# Patient Record
Sex: Male | Born: 1960 | Race: White | Hispanic: No | Marital: Married | Smoking: Never smoker
Health system: Southern US, Community
[De-identification: ages and names within clinical notes are randomized; demographics above are authoritative.]

## PROBLEM LIST (undated history)

## (undated) DIAGNOSIS — E785 Hyperlipidemia, unspecified: Secondary | ICD-10-CM

## (undated) DIAGNOSIS — I10 Essential (primary) hypertension: Secondary | ICD-10-CM

## (undated) DIAGNOSIS — M109 Gout, unspecified: Secondary | ICD-10-CM

## (undated) DIAGNOSIS — I719 Aortic aneurysm of unspecified site, without rupture: Secondary | ICD-10-CM

## (undated) DIAGNOSIS — E079 Disorder of thyroid, unspecified: Secondary | ICD-10-CM

## (undated) DIAGNOSIS — N289 Disorder of kidney and ureter, unspecified: Secondary | ICD-10-CM

## (undated) DIAGNOSIS — Z87442 Personal history of urinary calculi: Secondary | ICD-10-CM

## (undated) HISTORY — PX: BACK SURGERY: SHX140

## (undated) HISTORY — PX: ANKLE ARTHROSCOPY: SHX545

## (undated) HISTORY — PX: KNEE SURGERY: SHX244

## (undated) HISTORY — DX: Hyperlipidemia, unspecified: E78.5

---

## 2003-12-28 ENCOUNTER — Emergency Department (HOSPITAL_COMMUNITY): Admission: EM | Admit: 2003-12-28 | Discharge: 2003-12-28 | Payer: Self-pay | Admitting: *Deleted

## 2003-12-30 ENCOUNTER — Ambulatory Visit (HOSPITAL_COMMUNITY): Admission: RE | Admit: 2003-12-30 | Discharge: 2003-12-30 | Payer: Self-pay | Admitting: Internal Medicine

## 2004-01-07 ENCOUNTER — Observation Stay (HOSPITAL_COMMUNITY): Admission: RE | Admit: 2004-01-07 | Discharge: 2004-01-08 | Payer: Self-pay | Admitting: General Surgery

## 2006-02-05 ENCOUNTER — Emergency Department (HOSPITAL_COMMUNITY): Admission: EM | Admit: 2006-02-05 | Discharge: 2006-02-05 | Payer: Self-pay | Admitting: Emergency Medicine

## 2006-03-17 ENCOUNTER — Ambulatory Visit (HOSPITAL_COMMUNITY): Admission: RE | Admit: 2006-03-17 | Discharge: 2006-03-17 | Payer: Self-pay | Admitting: Urology

## 2006-06-13 ENCOUNTER — Emergency Department (HOSPITAL_COMMUNITY): Admission: EM | Admit: 2006-06-13 | Discharge: 2006-06-13 | Payer: Self-pay | Admitting: Emergency Medicine

## 2006-06-13 ENCOUNTER — Ambulatory Visit (HOSPITAL_COMMUNITY): Admission: RE | Admit: 2006-06-13 | Discharge: 2006-06-13 | Payer: Self-pay | Admitting: Urology

## 2006-06-14 ENCOUNTER — Observation Stay (HOSPITAL_COMMUNITY): Admission: EM | Admit: 2006-06-14 | Discharge: 2006-06-16 | Payer: Self-pay | Admitting: Emergency Medicine

## 2006-06-21 ENCOUNTER — Ambulatory Visit (HOSPITAL_COMMUNITY): Admission: RE | Admit: 2006-06-21 | Discharge: 2006-06-21 | Payer: Self-pay | Admitting: Urology

## 2006-06-22 ENCOUNTER — Ambulatory Visit (HOSPITAL_COMMUNITY): Admission: RE | Admit: 2006-06-22 | Discharge: 2006-06-22 | Payer: Self-pay | Admitting: Urology

## 2007-10-10 IMAGING — CR DG ABDOMEN 1V
2 series · 2 of 2 positions shown · non-contrast
Comparison: none

CLINICAL DATA: Kidney stone. Left-sided pain.  Left-sided ureteral stone.
 ABDOMEN ? 1 VIEW (2 IMAGES):

[view not recorded (1 of 2)]
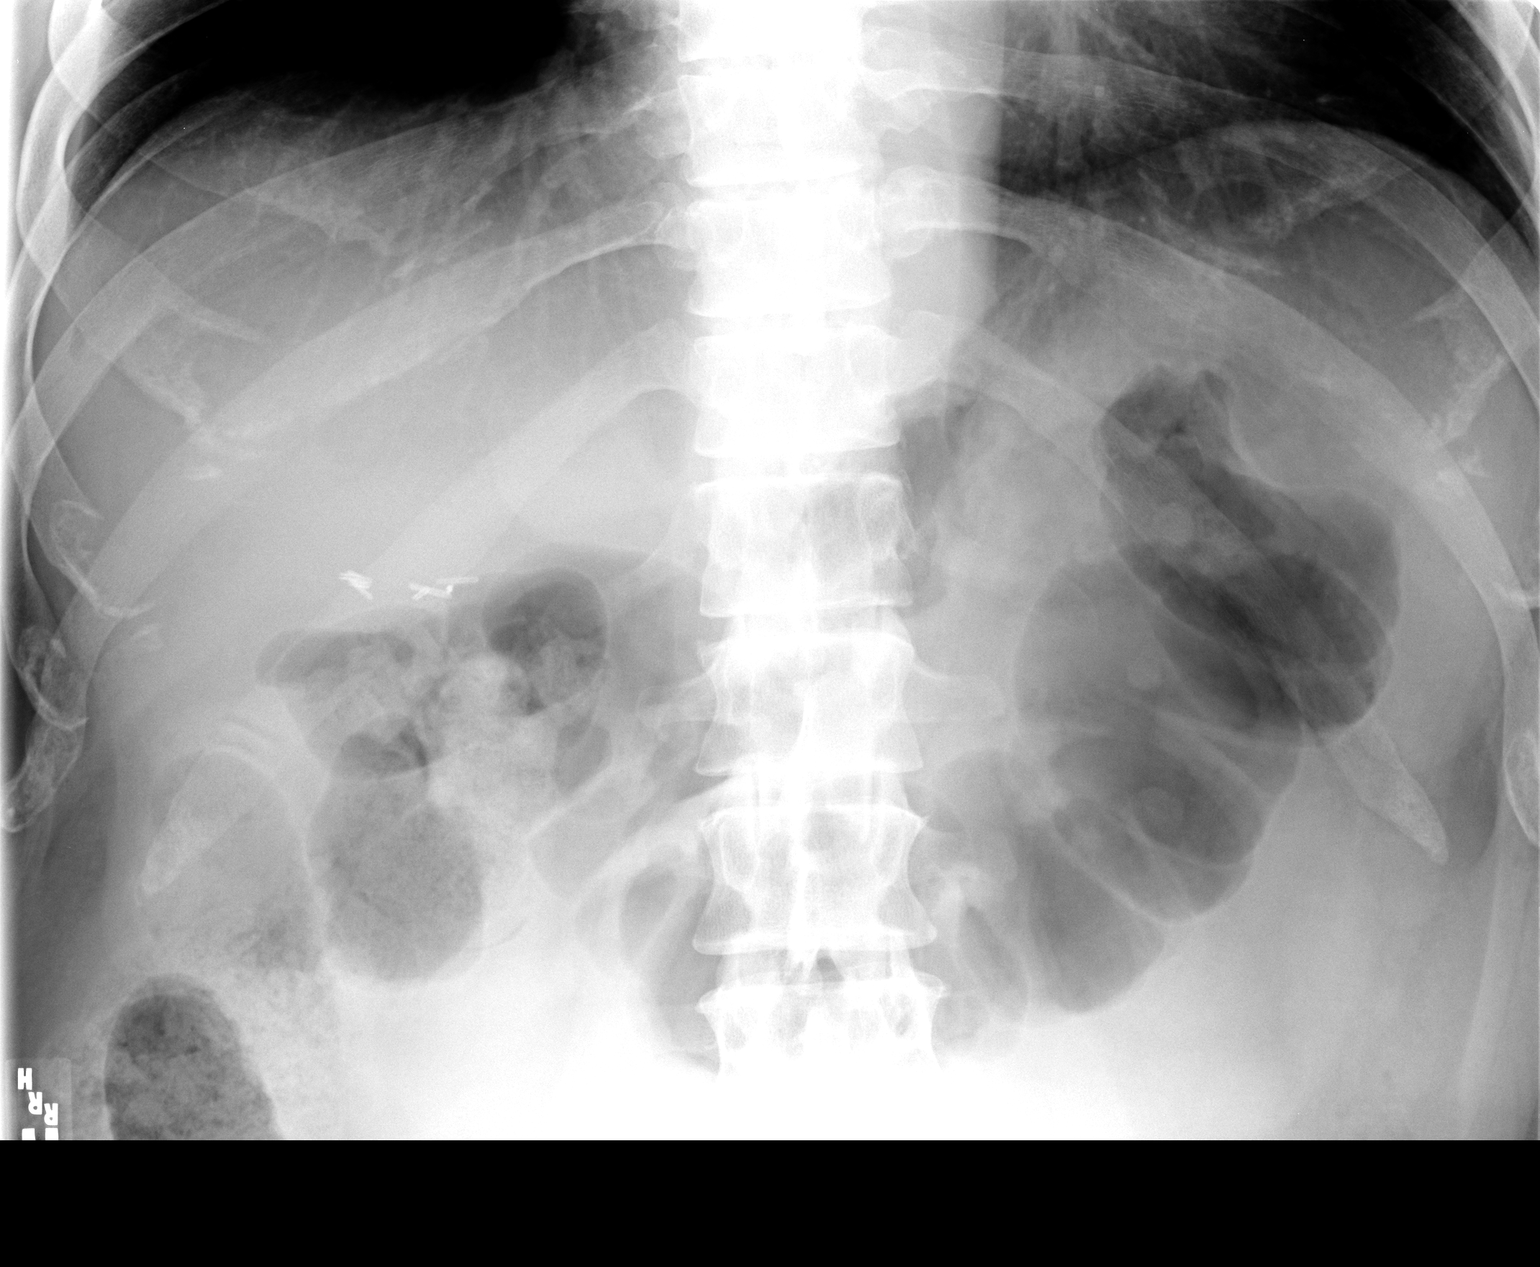

[view not recorded (2 of 2)]
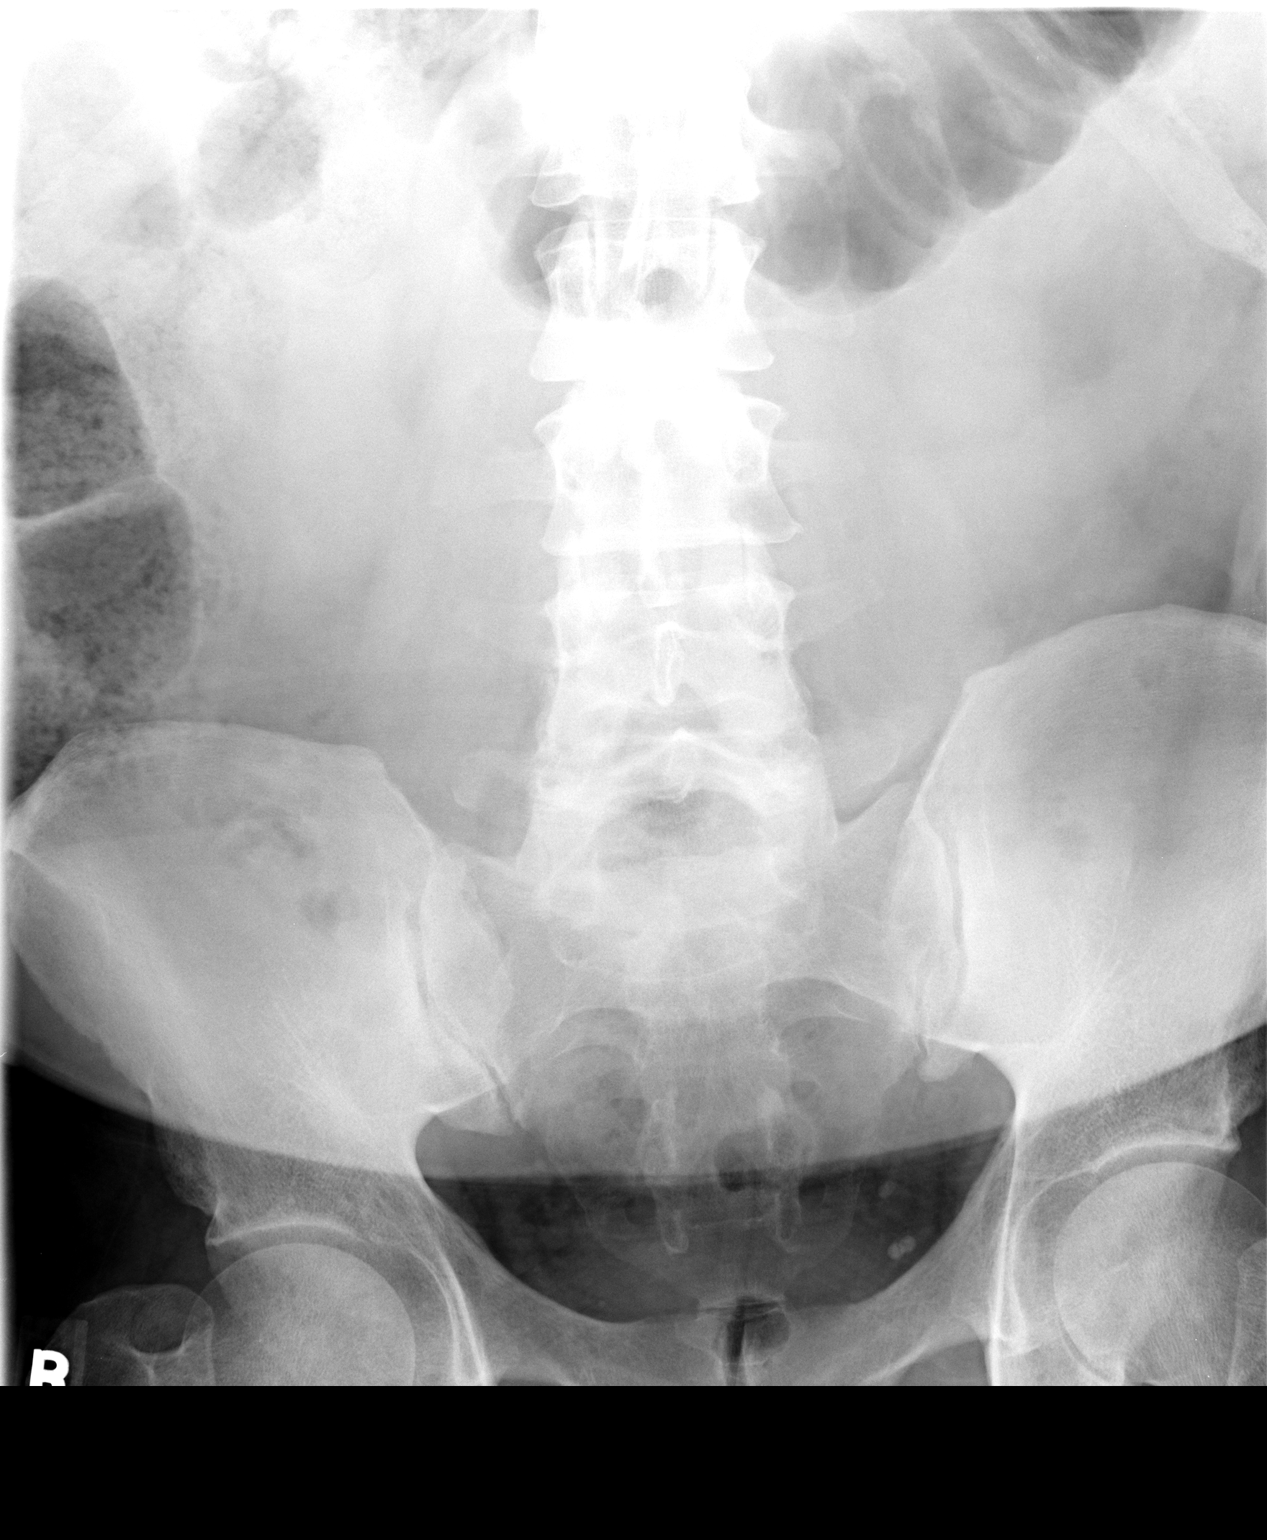

[2 of 2 positions shown; findings below may reference images not displayed]

FINDINGS: 06/13/06 CT scan revealed 5 mm stone in the distal left ureter.  I do not identify a definite calculus overlying the kidneys. There are three or four small calcifications in the low left true pelvis with what appears to be two contiguous stones at the level of the left ischial spine, which combined, form transverse dimensions of 3.7 x 6.2 mm.  Major differential considerations are calcified phleboliths versus stone(s).
IMPRESSION: Small calcifications in the low left true pelvis, one of which may represent the stone noted on 06/13/06 CT.

## 2011-07-20 ENCOUNTER — Telehealth: Payer: Self-pay

## 2011-07-20 NOTE — Telephone Encounter (Signed)
LMOM for pt to call. 

## 2011-07-21 NOTE — Telephone Encounter (Signed)
Opened in error

## 2011-07-21 NOTE — Telephone Encounter (Signed)
LMOM to call.

## 2011-07-22 ENCOUNTER — Other Ambulatory Visit: Payer: Self-pay

## 2011-07-22 DIAGNOSIS — Z139 Encounter for screening, unspecified: Secondary | ICD-10-CM

## 2011-07-22 NOTE — Telephone Encounter (Signed)
Mailing a letter for pt to call to get scheduled for screening colonoscopy.

## 2011-07-26 ENCOUNTER — Telehealth: Payer: Self-pay

## 2011-07-26 NOTE — Telephone Encounter (Signed)
OK for colonoscopy.  

## 2011-07-26 NOTE — Telephone Encounter (Signed)
Gastroenterology Pre-Procedure Form  Request Date: 08/20/2011       Requesting Physician: Dwana Melena, MD     PATIENT INFORMATION:  Roger Orozco is a 50 y.o., male (DOB=1960-12-03).  PROCEDURE: Procedure(s) requested: colonoscopy Procedure Reason: screening for colon cancer  PATIENT REVIEW QUESTIONS: The patient reports the following:   1. Diabetes Melitis: no 2. Joint replacements in the past 12 months: no 3. Major health problems in the past 3 months: no 4. Has an artificial valve or MVP:no 5. Has been advised in past to take antibiotics in advance of a procedure like teeth cleaning: no}    MEDICATIONS & ALLERGIES:    Patient reports the following regarding taking any blood thinners:   Plavix? no Aspirin?no Coumadin?  no  Patient confirms/reports the following medications:  Current Outpatient Prescriptions  Medication Sig Dispense Refill  . allopurinol (ZYLOPRIM) 100 MG tablet Take 100 mg by mouth daily.        . colchicine 0.6 MG tablet Take 0.6 mg by mouth daily.        . Dutasteride-Tamsulosin HCl (JALYN) 0.5-0.4 MG CAPS Take by mouth.        . nebivolol (BYSTOLIC) 5 MG tablet Take 5 mg by mouth daily.          Patient confirms/reports the following allergies:  No Known Allergies  Patient is appropriate to schedule for requested procedure(s): yes  AUTHORIZATION INFORMATION Primary Insurance:   ID #:   Group #:  Pre-Cert / Auth required:  Pre-Cert / Auth #:   Secondary Insurance:  ID #:   Group #:  Pre-Cert / Auth required Pre-Cert / Auth #:   No orders of the defined types were placed in this encounter.    SCHEDULE INFORMATION: Procedure has been scheduled as follows:  Date: 08/20/2011        Time: 10:00 AM  Location: [facility] Short Stay  This Gastroenterology Pre-Precedure Form is being routed to the following provider(s) for review: R. Roetta Sessions, MD

## 2011-07-26 NOTE — Telephone Encounter (Signed)
Rx and instructions mailed.  

## 2011-07-26 NOTE — Telephone Encounter (Signed)
Pt called back on 07/22/2011 and was triaged.

## 2011-08-20 ENCOUNTER — Ambulatory Visit (HOSPITAL_COMMUNITY)
Admission: RE | Admit: 2011-08-20 | Payer: Managed Care, Other (non HMO) | Source: Ambulatory Visit | Admitting: Internal Medicine

## 2011-08-20 ENCOUNTER — Encounter (HOSPITAL_COMMUNITY): Admission: RE | Payer: Self-pay | Source: Ambulatory Visit

## 2011-08-20 SURGERY — COLONOSCOPY
Anesthesia: Moderate Sedation

## 2019-07-02 ENCOUNTER — Other Ambulatory Visit: Payer: Self-pay | Admitting: Internal Medicine

## 2019-07-02 ENCOUNTER — Telehealth: Payer: Self-pay | Admitting: *Deleted

## 2019-07-02 DIAGNOSIS — R17 Unspecified jaundice: Secondary | ICD-10-CM

## 2019-07-02 DIAGNOSIS — R748 Abnormal levels of other serum enzymes: Secondary | ICD-10-CM

## 2019-07-04 ENCOUNTER — Telehealth: Payer: Self-pay | Admitting: *Deleted

## 2019-07-04 ENCOUNTER — Encounter (INDEPENDENT_AMBULATORY_CARE_PROVIDER_SITE_OTHER): Payer: Self-pay | Admitting: *Deleted

## 2020-03-17 ENCOUNTER — Other Ambulatory Visit: Payer: Self-pay

## 2020-03-17 ENCOUNTER — Ambulatory Visit
Admission: EM | Admit: 2020-03-17 | Discharge: 2020-03-17 | Disposition: A | Discharge status: Home / Self Care | Payer: Managed Care, Other (non HMO) | Attending: Family Medicine | Admitting: Family Medicine

## 2020-03-17 ENCOUNTER — Encounter: Payer: Self-pay | Admitting: Emergency Medicine

## 2020-03-17 DIAGNOSIS — R519 Headache, unspecified: Secondary | ICD-10-CM

## 2020-03-17 DIAGNOSIS — Z1152 Encounter for screening for COVID-19: Secondary | ICD-10-CM

## 2020-03-17 DIAGNOSIS — Z20822 Contact with and (suspected) exposure to covid-19: Secondary | ICD-10-CM

## 2020-03-17 HISTORY — DX: Gout, unspecified: M10.9

## 2020-03-17 HISTORY — DX: Disorder of kidney and ureter, unspecified: N28.9

## 2020-03-17 HISTORY — DX: Disorder of thyroid, unspecified: E07.9

## 2020-03-17 HISTORY — DX: Essential (primary) hypertension: I10

## 2020-03-17 NOTE — ED Provider Notes (Signed)
[facility]   [id number] 03/17/20 Arrival Time: 1135   CC: COVID symptoms  SUBJECTIVE: History from: patient.  Roger Orozco is a 59 y.o. male who presents with positive Covid exposure. Reports that his daughter has Covid. Denies recent travel. Has not taken OTC medications for this. There are no aggravating symptoms. No hx Covid. Has had Covid vaccines. Denies fever, chills, fatigue, sinus pain, rhinorrhea, sore throat, SOB, wheezing, chest pain, nausea, changes in bowel or bladder habits.    ROS: As per HPI.  All other pertinent ROS negative.     Past Medical History:  Diagnosis Date  . Gout   . Hypertension   . Renal disorder    kidney stones   . Thyroid disease    Past Surgical History:  Procedure Laterality Date  . BACK SURGERY    . KNEE SURGERY     when pt was 59 years old    No Known Allergies No current facility-administered medications on file prior to encounter.   Current Outpatient Medications on File Prior to Encounter  Medication Sig Dispense Refill  . allopurinol (ZYLOPRIM) 100 MG tablet Take 100 mg by mouth daily.      . colchicine 0.6 MG tablet Take 0.6 mg by mouth daily.      . nebivolol (BYSTOLIC) 5 MG tablet Take 5 mg by mouth daily.      . Dutasteride-Tamsulosin HCl (JALYN) 0.5-0.4 MG CAPS Take by mouth.       Social History   Socioeconomic History  . Marital status: Married    Spouse name: Not on file  . Number of children: Not on file  . Years of education: Not on file  . Highest education level: Not on file  Occupational History  . Not on file  Tobacco Use  . Smoking status: Never Smoker  . Smokeless tobacco: Never Used  Substance and Sexual Activity  . Alcohol use: Yes    Comment: occ  . Drug use: Never  . Sexual activity: Not on file  Other Topics Concern  . Not on file  Social History Narrative  . Not on file   Social Determinants of Health   Financial Resource Strain:   . Difficulty of Paying Living Expenses:     Food Insecurity:   . Worried About Programme researcher, broadcasting/film/video in the Last Year:   . Barista in the Last Year:   Transportation Needs:   . Freight forwarder (Medical):   Marland Kitchen Lack of Transportation (Non-Medical):   Physical Activity:   . Days of Exercise per Week:   . Minutes of Exercise per Session:   Stress:   . Feeling of Stress :   Social Connections:   . Frequency of Communication with Friends and Family:   . Frequency of Social Gatherings with Friends and Family:   . Attends Religious Services:   . Active Member of Clubs or Organizations:   . Attends Banker Meetings:   Marland Kitchen Marital Status:   Intimate Partner Violence:   . Fear of Current or Ex-Partner:   . Emotionally Abused:   Marland Kitchen Physically Abused:   . Sexually Abused:    Family History  Problem Relation Age of Onset  . Hypertension Mother     OBJECTIVE:  Vitals:   03/17/20 1200 03/17/20 1206  BP:  (!) 137/96  Pulse:  84  Resp:  17  Temp:  98.8 F (37.1 C)  TempSrc:  Oral  SpO2:  97%  Weight: (!) 233 lb 11 oz (106 kg)   Height: 6\' 2"  (1.88 m)      General appearance: alert; appears fatigued, but nontoxic; speaking in full sentences and tolerating own secretions HEENT: NCAT; Ears: EACs clear, TMs pearly gray; Eyes: PERRL.  EOM grossly intact. Sinuses: nontender; Nose: nares patent without rhinorrhea, Throat: oropharynx clear, tonsils non erythematous or enlarged, uvula midline  Neck: supple without LAD Lungs: unlabored respirations, symmetrical air entry; cough: absent; no respiratory distress; CTAB Heart: regular rate and rhythm.  Radial pulses 2+ symmetrical bilaterally Skin: warm and dry Psychological: alert and cooperative; normal mood and affect  LABS:  No results found for this or any previous visit (from the past 24 hour(s)).   ASSESSMENT & PLAN:  1. Close exposure to COVID-19 virus   2. Nonintractable headache, unspecified chronicity pattern, unspecified headache type   3.  Encounter for screening for COVID-19      COVID testing ordered.  It will take between 1-2 days for test results.  Someone will contact you regarding abnormal results.    Patient should remain in quarantine until they have received Covid results.  If negative you may resume normal activities (go back to work/school) while practicing hand hygiene, social distance, and mask wearing.  If positive, patient should remain in quarantine for 10 days from symptom onset AND greater than 72 hours after symptoms resolution (absence of fever without the use of fever-reducing medication and improvement in respiratory symptoms), whichever is longer Get plenty of rest and push fluids Use OTC zyrtec for nasal congestion, runny nose, and/or sore throat Use OTC flonase for nasal congestion and runny nose Use medications daily for symptom relief Use OTC medications like ibuprofen or tylenol as needed fever or pain Call or go to the ED if you have any new or worsening symptoms such as fever, worsening cough, shortness of breath, chest tightness, chest pain, turning blue, changes in mental status.  Reviewed expectations re: course of current medical issues. Questions answered. Outlined signs and symptoms indicating need for more acute intervention. Patient verbalized understanding. After Visit Summary given.         , NP 03/23/20 [phone]

## 2020-03-17 NOTE — ED Triage Notes (Signed)
Pt exposed to daughter who has covid this past Saturday.  Pt reports mild headache.  Needs covid test for work. Pt is fully vaccinated.

## 2020-03-17 NOTE — Discharge Instructions (Signed)
Your COVID test is pending.  You should self quarantine until the test result is back.    Take Tylenol as needed for fever or discomfort.  Rest and keep yourself hydrated.    Go to the emergency department if you develop acute worsening symptoms.     

## 2020-03-18 LAB — SARS-COV-2, NAA 2 DAY TAT

## 2020-03-18 LAB — NOVEL CORONAVIRUS, NAA: SARS-CoV-2, NAA: NOT DETECTED

## 2020-08-31 NOTE — Progress Notes (Addendum)
Cardiology Office Note:   Date:  09/03/2020  NAME:  Roger Orozco    MRN: [id number] DOB:  03-01-1961   PCP:  Roger Orozco  Cardiologist:  No primary care provider on file.   Referring Orozco: Roger Orozco   Chief Complaint  Patient presents with  . Palpitations        History of Present Illness:   Roger Orozco is a 60 y.o. male with a hx of HTN who is being seen today for the evaluation of palpitations at the request of Roger Orozco.  Total cholesterol 167, HDL 42, LDL 100, triglycerides 143.  He reports for the last 2 months he has noticed that patient symptoms occur 2-3 times a week.  Described as a rapid heartbeat sensation.  He reports he has used his heart rate monitor and noticed heart rates in the 130 to 140 bpm range.  This occurs when he is not doing strenuous activity.  Symptoms can also occur with activity.  Symptoms lasted roughly 10 minutes and go away.  There is no chest pain or associated shortness of breath.  He is able to do a full day's work without any major limitations.  He is able to play golf and do other activities without any major issues.  Review of lab work from his primary care physician show he is now diabetic with an A1c of 6.5.  I cannot see his LDL cholesterol.  He is on a statin.  He also takes blood pressure medication.  BP 140/88.  BMI 33 in office today.  He reports his brother has atrial fibrillation.  This is bothersome to him.  No strong family history of heart disease.  He is a never smoker.  He consumes alcohol in moderation.  No drug use reported.  [place] [employer].  EKG in office shows sinus bradycardia with no acute ischemic changes or prior infarction.  He reports he lost 60 pounds over the pandemic.  He used to snore but apparently this resolved with weight loss.  No excess caffeine consumption reported.  Recent TSH 1.62.  Problem List 1. DM -A1c 6.5 2. HTN 3. HLD -Total cholesterol 167,  triglycerides 143, HDL 42, LDL 100 4. Obesity -BMI 33  Past Medical History: Past Medical History:  Diagnosis Date  . Gout   . Hyperlipidemia   . Hypertension   . Renal disorder    kidney stones   . Thyroid disease     Past Surgical History: Past Surgical History:  Procedure Laterality Date  . BACK SURGERY    . KNEE SURGERY     when pt was 60 years old     Current Medications: Current Meds  Medication Sig  . allopurinol (ZYLOPRIM) 300 MG tablet Take 300 mg by mouth daily.  . Dutasteride-Tamsulosin HCl 0.5-0.4 MG CAPS Take by mouth.  . finasteride (PROSCAR) 5 MG tablet Take 5 mg by mouth daily.  Marland Kitchen. levothyroxine (SYNTHROID) 100 MCG tablet Take 100 mcg by mouth every morning.  . olmesartan (BENICAR) 20 MG tablet   . Rosuvastatin Calcium 10 MG CPSP   . [DISCONTINUED] allopurinol (ZYLOPRIM) 100 MG tablet Take 100 mg by mouth daily.     Allergies:    Patient has no known allergies.   Social History: Social History   Socioeconomic History  . Marital status: Married    Spouse name: Not on file  . Number of children: 1  .  Years of education: Not on file  . Highest education level: Not on file  Occupational History  . Occupation: Art therapist  Tobacco Use  . Smoking status: Never Smoker  . Smokeless tobacco: Never Used  Substance and Sexual Activity  . Alcohol use: Yes    Comment: occ  . Drug use: Never  . Sexual activity: Not on file  Other Topics Concern  . Not on file  Social History Narrative  . Not on file   Social Determinants of Health   Financial Resource Strain: Not on file  Food Insecurity: Not on file  Transportation Needs: Not on file  Physical Activity: Not on file  Stress: Not on file  Social Connections: Not on file     Family History: The patient's family history includes Heart disease in his sister; Hypertension in his mother.  ROS:   All other ROS reviewed and negative. Pertinent positives noted in the HPI.     EKGs/Labs/Other  Studies Reviewed:   The following studies were personally reviewed by me today:  EKG:  EKG is ordered today.  The ekg ordered today demonstrates sinus bradycardia heart rate 57, no acute ischemic changes, no evidence of prior infarct, and was personally reviewed by me.   Recent Labs: No results found for requested labs within last 8760 hours.   Recent Lipid Panel No results found for: CHOL, TRIG, HDL, CHOLHDL, VLDL, LDLCALC, LDLDIRECT  Physical Exam:   VS:  BP 140/88   Pulse (!) 57   Ht 6\' 2"  (1.88 m)   Wt 259 lb 12.8 oz (117.8 kg)   SpO2 95%   BMI 33.36 kg/m    Wt Readings from Last 3 Encounters:  09/03/20 259 lb 12.8 oz (117.8 kg)  03/17/20 (!) 233 lb 11 oz (106 kg)    General: Well nourished, well developed, in no acute distress Head: Atraumatic, normal size  Eyes: PEERLA, EOMI  Neck: Supple, no JVD Endocrine: No thryomegaly Cardiac: Normal S1, S2; RRR; 2 out of 6 systolic ejection murmur Lungs: Clear to auscultation bilaterally, no wheezing, rhonchi or rales  Abd: Soft, nontender, no hepatomegaly  Ext: No edema, pulses 2+ Musculoskeletal: No deformities, BUE and BLE strength normal and equal Skin: Warm and dry, no rashes   Neuro: Alert and oriented to person, place, time, and situation, CNII-XII grossly intact, no focal deficits  Psych: Normal mood and affect   ASSESSMENT:   Roger Orozco is a 60 y.o. male who presents for the following: 1. Palpitations   2. Murmur   3. Mixed hyperlipidemia     PLAN:   1. Palpitations -2-3 episodes per week of palpitations.  Recent TSH from primary care physician normal at 1.62.  EKG in office shows sinus bradycardia heart rate 57.  He does have a 2 out of 6 systolic ejection murmur.  This is an innocent murmur.  Could be obesity related.  Could be mild aortic stenosis.  I recommended an echocardiogram.  I would like to proceed with a 7-day Zio patch to exclude arrhythmia.  He could be having A. fib versus PACs or other ectopy.  We  will need to exclude this with a monitor.  2. Murmur -Echocardiogram as above.  3. Mixed hyperlipidemia -Continue Crestor.  A1c 6.5.  LDL 100.  We will increase his Crestor at her next visit.  Disposition: Return in about 3 months (around 12/02/2020).  Medication Adjustments/Labs and Tests Ordered: Current medicines are reviewed at length with the patient today.  Concerns  regarding medicines are outlined above.  Orders Placed This Encounter  Procedures  . LONG TERM MONITOR (3-14 DAYS)  . EKG 12-Lead  . ECHOCARDIOGRAM COMPLETE   No orders of the defined types were placed in this encounter.   Patient Instructions  Medication Instructions:  The current medical regimen is effective;  continue present plan and medications.  *If you need a refill on your cardiac medications before your next appointment, please call your pharmacy*  Testing/Procedures: Echocardiogram - Your physician has requested that you have an echocardiogram. Echocardiography is a painless test that uses sound waves to create images of your heart. It provides your doctor with information about the size and shape of your heart and how well your heart's chambers and valves are working. This procedure takes approximately one hour. There are no restrictions for this procedure. This will be performed at our [facility] location - [address], [address].   ZIO XT- Long Term Monitor Instructions   Your physician has requested you wear your ZIO patch monitor___7____days.   This is a single patch monitor.  [employer] supplies one patch monitor per enrollment.  Additional stickers are not available.   Please do not apply patch if you will be having a Nuclear Stress Test, Echocardiogram, Cardiac CT, MRI, or Chest Xray during the time frame you would be wearing the monitor. The patch cannot be worn during these tests.  You cannot remove and re-apply the ZIO XT patch monitor.   Your ZIO patch monitor will be sent USPS Priority  mail from [facility] directly to your home address. The monitor may also be mailed to a PO BOX if home delivery is not available.   It may take 3-5 days to receive your monitor after you have been enrolled.   Once you have received you monitor, please review enclosed instructions.  Your monitor has already been registered assigning a specific monitor serial # to you.   Applying the monitor   Shave hair from upper left chest.   Hold abrader disc by orange tab.  Rub abrader in 40 strokes over left upper chest as indicated in your monitor instructions.   Clean area with 4 enclosed alcohol pads .  Use all pads to assure are is cleaned thoroughly.  Let dry.   Apply patch as indicated in monitor instructions.  Patch will be place under collarbone on left side of chest with arrow pointing upward.   Rub patch adhesive wings for 2 minutes.Remove white label marked "1".  Remove white label marked "2".  Rub patch adhesive wings for 2 additional minutes.   While looking in a mirror, press and release button in center of patch.  A small green light will flash 3-4 times .  This will be your only indicator the monitor has been turned on.     Do not shower for the first 24 hours.  You may shower after the first 24 hours.   Press button if you feel a symptom. You will hear a small click.  Record Date, Time and Symptom in the Patient Log Book.   When you are ready to remove patch, follow instructions on last 2 pages of Patient Log Book.  Stick patch monitor onto last page of Patient Log Book.   Place Patient Log Book in [place] box.  Use locking tab on box and tape box closed securely.  The [employer] and [employer] has [employer] on it.  Please place in mailbox as soon as  possible.  Your physician should have your test results approximately 7 days after the monitor has been mailed back to [facility].   Call [facility] Customer Care at [phone] if you have questions regarding your ZIO XT  patch monitor.  Call them immediately if you see an orange light blinking on your monitor.   If your monitor falls off in less than 4 days contact our Monitor department at [phone].  If your monitor becomes loose or falls off after 4 days call [employer] at [phone] for suggestions on securing your monitor.    Follow-Up: At [facility], you and your health needs are our priority.  As part of our continuing mission to provide you with exceptional heart care, we have created designated Provider Care Teams.  These Care Teams include your primary Cardiologist (physician) and Advanced Practice Providers (APPs -  Physician Assistants and Nurse Practitioners) who all work together to provide you with the care you need, when you need it.  We recommend signing up for the patient portal called "MyChart".  Sign up information is provided on this After Visit Summary.  MyChart is used to connect with patients for Virtual Visits (Telemedicine).  Patients are able to view lab/test results, encounter notes, upcoming appointments, etc.  Non-urgent messages can be sent to your provider as well.   To learn more about what you can do with MyChart, go to [email or web address].    Your next appointment:   3 month(s)  The format for your next appointment:   In Person  Provider:   Lennie Odor, Orozco         Signed, Lenna Gilford. Flora Lipps, Orozco [facility]  [address] [place], [place] [address] [phone]  09/03/2020 9:13 AM

## 2020-09-03 ENCOUNTER — Other Ambulatory Visit: Payer: Self-pay

## 2020-09-03 ENCOUNTER — Ambulatory Visit: Payer: Managed Care, Other (non HMO) | Admitting: Cardiovascular Disease

## 2020-09-03 ENCOUNTER — Ambulatory Visit (INDEPENDENT_AMBULATORY_CARE_PROVIDER_SITE_OTHER): Payer: Managed Care, Other (non HMO)

## 2020-09-03 ENCOUNTER — Encounter: Payer: Self-pay | Admitting: *Deleted

## 2020-09-03 ENCOUNTER — Encounter: Payer: Self-pay | Admitting: Cardiovascular Disease

## 2020-09-03 VITALS — BP 140/88 | HR 57 | Ht 74.0 in | Wt 259.8 lb

## 2020-09-03 DIAGNOSIS — R011 Cardiac murmur, unspecified: Secondary | ICD-10-CM | POA: Diagnosis not present

## 2020-09-03 DIAGNOSIS — R002 Palpitations: Secondary | ICD-10-CM

## 2020-09-03 DIAGNOSIS — E782 Mixed hyperlipidemia: Secondary | ICD-10-CM

## 2020-09-03 NOTE — Progress Notes (Signed)
Patient ID: Roger Orozco, male   DOB: [birth year], 60 y.o.   MRN: [id number] Patient enrolled for [employer] to ship a 7 day ZIO XT long term holter monitor to his home.

## 2020-09-03 NOTE — Patient Instructions (Signed)
Medication Instructions:  The current medical regimen is effective;  continue present plan and medications.  *If you need a refill on your cardiac medications before your next appointment, please call your pharmacy*  Testing/Procedures: Echocardiogram - Your physician has requested that you have an echocardiogram. Echocardiography is a painless test that uses sound waves to create images of your heart. It provides your doctor with information about the size and shape of your heart and how well your heart's chambers and valves are working. This procedure takes approximately one hour. There are no restrictions for this procedure. This will be performed at our [facility] location - [address].   ZIO XT- Long Term Monitor Instructions   Your physician has requested you wear your ZIO patch monitor___7____days.   This is a single patch monitor.  [employer] supplies one patch monitor per enrollment.  Additional stickers are not available.   Please do not apply patch if you will be having a Nuclear Stress Test, Echocardiogram, Cardiac CT, MRI, or Chest Xray during the time frame you would be wearing the monitor. The patch cannot be worn during these tests.  You cannot remove and re-apply the ZIO XT patch monitor.   Your ZIO patch monitor will be sent USPS Priority mail from [employer] directly to your home address. The monitor may also be mailed to a PO BOX if home delivery is not available.   It may take 3-5 days to receive your monitor after you have been enrolled.   Once you have received you monitor, please review enclosed instructions.  Your monitor has already been registered assigning a specific monitor serial # to you.   Applying the monitor   Shave hair from upper left chest.   Hold abrader disc by orange tab.  Rub abrader in 40 strokes over left upper chest as indicated in your monitor instructions.   Clean area with 4 enclosed alcohol pads .  Use all pads to  assure are is cleaned thoroughly.  Let dry.   Apply patch as indicated in monitor instructions.  Patch will be place under collarbone on left side of chest with arrow pointing upward.   Rub patch adhesive wings for 2 minutes.Remove white label marked "1".  Remove white label marked "2".  Rub patch adhesive wings for 2 additional minutes.   While looking in a mirror, press and release button in center of patch.  A small green light will flash 3-4 times .  This will be your only indicator the monitor has been turned on.     Do not shower for the first 24 hours.  You may shower after the first 24 hours.   Press button if you feel a symptom. You will hear a small click.  Record Date, Time and Symptom in the Patient Log Book.   When you are ready to remove patch, follow instructions on last 2 pages of Patient Log Book.  Stick patch monitor onto last page of Patient Log Book.   Place Patient Log Book in [place] box.  Use locking tab on box and tape box closed securely.  The [employer] has [employer] on it.  Please place in mailbox as soon as possible.  Your physician should have your test results approximately 7 days after the monitor has been mailed back to [facility].   Call [facility] Customer Care at [phone] if you have questions regarding your ZIO XT patch monitor.  Call them immediately if  you see an orange light blinking on your monitor.   If your monitor falls off in less than 4 days contact our Monitor department at [phone].  If your monitor becomes loose or falls off after 4 days call [employer] at [phone] for suggestions on securing your monitor.    Follow-Up: At [facility], you and your health needs are our priority.  As part of our continuing mission to provide you with exceptional heart care, we have created designated Provider Care Teams.  These Care Teams include your primary Cardiologist (physician) and Advanced Practice Providers (APPs -   Physician Assistants and Nurse Practitioners) who all work together to provide you with the care you need, when you need it.  We recommend signing up for the patient portal called "MyChart".  Sign up information is provided on this After Visit Summary.  MyChart is used to connect with patients for Virtual Visits (Telemedicine).  Patients are able to view lab/test results, encounter notes, upcoming appointments, etc.  Non-urgent messages can be sent to your provider as well.   To learn more about what you can do with MyChart, go to [email or web address].    Your next appointment:   3 month(s)  The format for your next appointment:   In Person  Provider:   Lennie Odor, MD

## 2020-09-06 DIAGNOSIS — R002 Palpitations: Secondary | ICD-10-CM | POA: Diagnosis not present

## 2020-09-23 ENCOUNTER — Other Ambulatory Visit: Payer: Self-pay

## 2020-09-23 ENCOUNTER — Ambulatory Visit (HOSPITAL_COMMUNITY): Payer: Managed Care, Other (non HMO) | Attending: Internal Medicine

## 2020-09-23 DIAGNOSIS — R011 Cardiac murmur, unspecified: Secondary | ICD-10-CM | POA: Diagnosis present

## 2020-09-23 LAB — ECHOCARDIOGRAM COMPLETE
Area-P 1/2: 2.83 cm2
S' Lateral: 2.6 cm

## 2020-09-25 ENCOUNTER — Other Ambulatory Visit: Payer: Self-pay

## 2020-09-25 DIAGNOSIS — I719 Aortic aneurysm of unspecified site, without rupture: Secondary | ICD-10-CM

## 2020-09-29 ENCOUNTER — Other Ambulatory Visit: Payer: Managed Care, Other (non HMO) | Admitting: *Deleted

## 2020-09-29 ENCOUNTER — Other Ambulatory Visit: Payer: Self-pay

## 2020-09-29 DIAGNOSIS — I719 Aortic aneurysm of unspecified site, without rupture: Secondary | ICD-10-CM

## 2020-09-30 LAB — BASIC METABOLIC PANEL
BUN/Creatinine Ratio: 17 (ref 10–24)
BUN: 18 mg/dL (ref 8–27)
CO2: 21 mmol/L (ref 20–29)
Calcium: 9.9 mg/dL (ref 8.6–10.2)
Chloride: 105 mmol/L (ref 96–106)
Creatinine, Ser: 1.03 mg/dL (ref 0.76–1.27)
GFR calc Af Amer: 90 mL/min/{1.73_m2} (ref >59)
GFR calc non Af Amer: 78 mL/min/{1.73_m2} (ref >59)
Glucose: 163 mg/dL — ABNORMAL HIGH (ref 65–99)
Potassium: 4.3 mmol/L (ref 3.5–5.2)
Sodium: 145 mmol/L — ABNORMAL HIGH (ref 134–144)

## 2020-10-03 ENCOUNTER — Other Ambulatory Visit: Payer: Self-pay

## 2020-10-03 ENCOUNTER — Ambulatory Visit (INDEPENDENT_AMBULATORY_CARE_PROVIDER_SITE_OTHER)
Admission: RE | Admit: 2020-10-03 | Discharge: 2020-10-03 | Disposition: A | Discharge status: Home / Self Care | Payer: Managed Care, Other (non HMO) | Source: Ambulatory Visit | Attending: Cardiovascular Disease | Admitting: Cardiovascular Disease

## 2020-10-03 DIAGNOSIS — I719 Aortic aneurysm of unspecified site, without rupture: Secondary | ICD-10-CM | POA: Diagnosis not present

## 2020-10-03 MED ORDER — IOHEXOL 350 MG/ML SOLN
75.0000 mL | Freq: Once | INTRAVENOUS | Status: AC | PRN
  Administered 2020-10-03: 75 mL via INTRAVENOUS

## 2020-10-09 ENCOUNTER — Ambulatory Visit: Payer: Managed Care, Other (non HMO) | Admitting: Cardiology

## 2020-11-17 ENCOUNTER — Telehealth: Payer: Self-pay | Admitting: Cardiovascular Disease

## 2020-11-17 NOTE — Telephone Encounter (Signed)
Dr Jens Som spoke with dr Tenny Craw office and okay was given for patient to get lidocaine injection into his gum. This note will be faxed to the number provided.

## 2020-11-17 NOTE — Telephone Encounter (Signed)
   [facility] Pre-operative Risk Assessment    Request for surgical clearance:  1. What type of surgery is being performed? crown  2. When is this surgery scheduled? today  3. What type of clearance is required (medical clearance vs. Pharmacy clearance to hold med vs. Both)? meds  4. Are there any medications that need to be held prior to surgery and how long? lidocaine  5. Practice name and name of physician performing surgery? Dr Thurnell Lose  6. What is your office phone number [phone]   7.   What is your office fax number [phone]  8.   Anesthesia type (None, local, MAC, general) ? Local    Roger Orozco 11/17/2020, 1:51 PM  _________________________________________________________________   (provider comments below)

## 2020-12-02 NOTE — Progress Notes (Signed)
Cardiology Office Note:   Date:  12/03/2020  NAME:  Roger Orozco    MRN: [id number] DOB:  1961/02/12   PCP:  Benita Stabile, MD  Cardiologist:  No primary care provider on file.   Referring MD: Benita Stabile, MD   Chief Complaint  Patient presents with  . Follow-up    History of Present Illness:   Roger Orozco is a 60 y.o. male with a hx of palpitations, DM, HTN, HLD who presents for follow-up. Symptoms of palpitations but monitor normal. Found to have aortic aneurysm on echo and confirmed on CTA.  He reports his palpitations have resolved.  No further episodes.  We discussed the cardia mobile device for extended monitoring of his heart rhythm.  Blood pressure 152/82.  Slightly elevated.  We discussed increasing his Benicar.  He also is diabetic with an A1c of 6.5.  LDL 100.  I would like more aggressive control.  We will increase his Crestor.  He denies any chest pain or chest tightness.  Still working in [employer] in [place].  Apparently a orders may run in the family.  He does report his sister has heart disease.  We did discuss results of his CTA which demonstrate an ascending aortic aneurysm.  I have asked him to maintain activity level including aerobic activity.  I would like him to avoid extremely heavy lifting.  He will do this.  We also discussed that he will need a repeat CT scan in 1 year.  He is okay to do this.  He has a tricuspid aortic valve.  Does not appear to be any association with bicuspid valve.  He does have a noticeable murmur on examination.  Likely will need closer evaluation of aortic valve at some point.  Problem List 1. DM -A1c 6.5 2. HTN 3. HLD -Total cholesterol 167, triglycerides 143, HDL 42, LDL 100 4. Obesity -BMI 33 5. Thoracic aortic aneurysm  -45 mm 10/03/2020  Past Medical History: Past Medical History:  Diagnosis Date  . Gout   . Hyperlipidemia   . Hypertension   . Renal disorder    kidney stones   .  Thyroid disease     Past Surgical History: Past Surgical History:  Procedure Laterality Date  . BACK SURGERY    . KNEE SURGERY     when pt was 60 years old     Current Medications: Current Meds  Medication Sig  . allopurinol (ZYLOPRIM) 300 MG tablet Take 300 mg by mouth daily.  . Dutasteride-Tamsulosin HCl 0.5-0.4 MG CAPS Take by mouth.  . finasteride (PROSCAR) 5 MG tablet Take 5 mg by mouth daily.  Marland Kitchen levothyroxine (SYNTHROID) 100 MCG tablet Take 100 mcg by mouth every morning.  . rosuvastatin (CRESTOR) 20 MG tablet Take 1 tablet (20 mg total) by mouth daily.  . [DISCONTINUED] olmesartan (BENICAR) 20 MG tablet   . [DISCONTINUED] Rosuvastatin Calcium 10 MG CPSP      Allergies:    Patient has no known allergies.   Social History: Social History   Socioeconomic History  . Marital status: Married    Spouse name: Not on file  . Number of children: 1  . Years of education: Not on file  . Highest education level: Not on file  Occupational History  . Occupation: Art therapist  Tobacco Use  . Smoking status: Never Smoker  . Smokeless tobacco: Never Used  Substance and Sexual Activity  . Alcohol use: Yes  Comment: occ  . Drug use: Never  . Sexual activity: Not on file  Other Topics Concern  . Not on file  Social History Narrative  . Not on file   Social Determinants of Health   Financial Resource Strain: Not on file  Food Insecurity: Not on file  Transportation Needs: Not on file  Physical Activity: Not on file  Stress: Not on file  Social Connections: Not on file     Family History: The patient's family history includes Heart disease in his sister; Hypertension in his mother.  ROS:   All other ROS reviewed and negative. Pertinent positives noted in the HPI.     EKGs/Labs/Other Studies Reviewed:   The following studies were personally reviewed by me today:   CTA 09/23/2020 1. Left ventricular ejection fraction, by estimation, is 65 to 70%. The  left  ventricle has normal function. The left ventricle has no regional  wall motion abnormalities. There is moderate left ventricular hypertrophy.  Left ventricular diastolic  parameters are consistent with Grade I diastolic dysfunction (impaired  relaxation).  2. Right ventricular systolic function is normal. The right ventricular  size is normal.  3. The mitral valve is grossly normal. Trivial mitral valve  regurgitation.  4. The aortic valve is tricuspid. Aortic valve regurgitation is trivial.  Mild to moderate aortic valve sclerosis/calcification is present, without  any evidence of aortic stenosis.  5. Aortic dilatation noted. There is moderate to severe dilatation of the  ascending aorta, measuring 48 mm. There is mild dilatation of the aortic  root, measuring 39 mm.  6. The inferior vena cava is normal in size with greater than 50%  respiratory variability, suggesting right atrial pressure of 3 mmHg.   Zio 09/22/2020  Impression: 1. No significant arrhythmias detected.  2. Symptoms occurred with normal sinus rhythm.  3. Rare ectopy.   Recent Labs: 09/29/2020: BUN 18; Creatinine, Ser 1.03; Potassium 4.3; Sodium 145   Recent Lipid Panel No results found for: CHOL, TRIG, HDL, CHOLHDL, VLDL, LDLCALC, LDLDIRECT  Physical Exam:   VS:  BP (!) 152/82   Pulse 71   Ht 6\' 3"  (1.905 m)   Wt 263 lb 3.2 oz (119.4 kg)   SpO2 96%   BMI 32.90 kg/m    Wt Readings from Last 3 Encounters:  12/03/20 263 lb 3.2 oz (119.4 kg)  09/03/20 259 lb 12.8 oz (117.8 kg)  03/17/20 (!) 233 lb 11 oz (106 kg)    General: Well nourished, well developed, in no acute distress Head: Atraumatic, normal size  Eyes: PEERLA, EOMI  Neck: Supple, no JVD Endocrine: No thryomegaly Cardiac: Normal S1, S2; RRR; 2 out of 6 systolic ejection Lungs: Clear to auscultation bilaterally, no wheezing, rhonchi or rales  Abd: Soft, nontender, no hepatomegaly  Ext: No edema, pulses 2+ Musculoskeletal: No deformities,  BUE and BLE strength normal and equal Skin: Warm and dry, no rashes   Neuro: Alert and oriented to person, place, time, and situation, CNII-XII grossly intact, no focal deficits  Psych: Normal mood and affect   ASSESSMENT:   Roger Orozco is a 60 y.o. male who presents for the following: 1. Ascending aortic aneurysm (HCC)   2. Primary hypertension   3. Mixed hyperlipidemia     PLAN:   1. Ascending aortic aneurysm (HCC) -Ascending aorta 45 mm on CTA. Incidentally found on his echocardiogram.  Tricuspid aortic valve.  We will plan to repeat follow-up CT in 1 year to look for change.  Aerobic  activity and light weight lifting is okay.  I have advised him to avoid extremely heavy lifting.  Does not appear to be a genetic aortopathy.  Likely suspect this is related to age as well  2. Primary hypertension -BP not controlled.  Increase Benicar to 40 mg daily.  3. Mixed hyperlipidemia -Given his diabetes we will proceed with more aggressive lipid-lowering agents.  Increase Crestor to 20 mg daily.  Goal LDL cholesterol.  Less than 70.  Disposition: Return in about 1 year (around 12/03/2021).  Medication Adjustments/Labs and Tests Ordered: Current medicines are reviewed at length with the patient today.  Concerns regarding medicines are outlined above.  Orders Placed This Encounter  Procedures  . CT ANGIO CHEST AORTA W/CM & OR WO/CM  . Basic metabolic panel   Meds ordered this encounter  Medications  . olmesartan (BENICAR) 40 MG tablet    Sig: Take 1 tablet (40 mg total) by mouth daily.    Dispense:  90 tablet    Refill:  1  . rosuvastatin (CRESTOR) 20 MG tablet    Sig: Take 1 tablet (20 mg total) by mouth daily.    Dispense:  90 tablet    Refill:  3    Patient Instructions  Medication Instructions:  Increase Benicar to 40 mg daily  Increase Crestor to 20 mg daily    *If you need a refill on your cardiac medications before your next appointment, please call your  pharmacy*   Lab Work: BMET- need before CTA CHEST in ONE YEAR  If you have labs (blood work) drawn today and your tests are completely normal, you will receive your results only by: Marland Kitchen MyChart Message (if you have MyChart) OR . A paper copy in the mail If you have any lab test that is abnormal or we need to change your treatment, we will call you to review the results.   Testing/Procedures: CTA CHEST (in one year before follow up appointment)  Follow-Up: At [facility], you and your health needs are our priority.  As part of our continuing mission to provide you with exceptional heart care, we have created designated Provider Care Teams.  These Care Teams include your primary Cardiologist (physician) and Advanced Practice Providers (APPs -  Physician Assistants and Nurse Practitioners) who all work together to provide you with the care you need, when you need it.  We recommend signing up for the patient portal called "MyChart".  Sign up information is provided on this After Visit Summary.  MyChart is used to connect with patients for Virtual Visits (Telemedicine).  Patients are able to view lab/test results, encounter notes, upcoming appointments, etc.  Non-urgent messages can be sent to your provider as well.   To learn more about what you can do with MyChart, go to [email or web address].    Your next appointment:   12 month(s)  The format for your next appointment:   In Person  Provider:   Lennie Odor, MD   Other Instructions K[employer]ia Mobile device      Time Spent with Patient: I have spent a total of 35 minutes with patient reviewing hospital notes, telemetry, EKGs, labs and examining the patient as well as establishing an assessment and plan that was discussed with the patient.  > 50% of time was spent in direct patient care.  Signed, Lenna Gilford. Flora Lipps, MD, [facility]  [facility]  [address], [address] [place], [place] [address] [phone]   12/03/2020 10:24 AM

## 2020-12-03 ENCOUNTER — Other Ambulatory Visit: Payer: Self-pay

## 2020-12-03 ENCOUNTER — Ambulatory Visit: Payer: Managed Care, Other (non HMO) | Admitting: Cardiovascular Disease

## 2020-12-03 ENCOUNTER — Encounter: Payer: Self-pay | Admitting: Cardiovascular Disease

## 2020-12-03 VITALS — BP 152/82 | HR 71 | Ht 75.0 in | Wt 263.2 lb

## 2020-12-03 DIAGNOSIS — I712 Thoracic aortic aneurysm, without rupture: Secondary | ICD-10-CM

## 2020-12-03 DIAGNOSIS — I10 Essential (primary) hypertension: Secondary | ICD-10-CM

## 2020-12-03 DIAGNOSIS — I7121 Aneurysm of the ascending aorta, without rupture: Secondary | ICD-10-CM

## 2020-12-03 DIAGNOSIS — E782 Mixed hyperlipidemia: Secondary | ICD-10-CM

## 2020-12-03 MED ORDER — ROSUVASTATIN CALCIUM 20 MG PO TABS: 20.0000 mg | ORAL_TABLET | Freq: Every day | ORAL | 3 refills | Status: DC

## 2020-12-03 MED ORDER — OLMESARTAN MEDOXOMIL 40 MG PO TABS: 40.0000 mg | ORAL_TABLET | Freq: Every day | ORAL | 1 refills | Status: DC

## 2020-12-03 NOTE — Patient Instructions (Signed)
Medication Instructions:  Increase Benicar to 40 mg daily  Increase Crestor to 20 mg daily    *If you need a refill on your cardiac medications before your next appointment, please call your pharmacy*   Lab Work: BMET- need before CTA CHEST in ONE YEAR  If you have labs (blood work) drawn today and your tests are completely normal, you will receive your results only by: Marland Kitchen MyChart Message (if you have MyChart) OR . A paper copy in the mail If you have any lab test that is abnormal or we need to change your treatment, we will call you to review the results.   Testing/Procedures: CTA CHEST (in one year before follow up appointment)  Follow-Up: At [facility], you and your health needs are our priority.  As part of our continuing mission to provide you with exceptional heart care, we have created designated Provider Care Teams.  These Care Teams include your primary Cardiologist (physician) and Advanced Practice Providers (APPs -  Physician Assistants and Nurse Practitioners) who all work together to provide you with the care you need, when you need it.  We recommend signing up for the patient portal called "MyChart".  Sign up information is provided on this After Visit Summary.  MyChart is used to connect with patients for Virtual Visits (Telemedicine).  Patients are able to view lab/test results, encounter notes, upcoming appointments, etc.  Non-urgent messages can be sent to your provider as well.   To learn more about what you can do with MyChart, go to [email or web address].    Your next appointment:   12 month(s)  The format for your next appointment:   In Person  Provider:   Lennie Odor, MD   Other Instructions [facility] device

## 2021-03-12 ENCOUNTER — Other Ambulatory Visit: Payer: Self-pay

## 2021-03-12 ENCOUNTER — Ambulatory Visit: Payer: Managed Care, Other (non HMO) | Admitting: Podiatry

## 2021-03-12 ENCOUNTER — Ambulatory Visit (INDEPENDENT_AMBULATORY_CARE_PROVIDER_SITE_OTHER): Payer: Managed Care, Other (non HMO)

## 2021-03-12 DIAGNOSIS — M2011 Hallux valgus (acquired), right foot: Secondary | ICD-10-CM

## 2021-03-12 DIAGNOSIS — Q66221 Congenital metatarsus adductus, right foot: Secondary | ICD-10-CM

## 2021-03-16 ENCOUNTER — Encounter: Payer: Self-pay | Admitting: Podiatry

## 2021-03-16 NOTE — Progress Notes (Signed)
  Subjective:  Patient ID: Roger Orozco, male    DOB: [birth year],  MRN: [id number]  Chief Complaint  Patient presents with   Bunions    Pt states he has right foot bunion that he has been dealing with for years. Pt states bunion isnt painful but causes issues with shoes. Pt states he is interested in Lapiplasty.     60 y.o. male presents with the above complaint. History confirmed with patient.  He is referred to me by Dr. Al Corpus.  He saw an ad for lipoplasty and thought it might be something to explore as a possible way to correct his bunion.  He is planning to retire later this year likes to be active and play golf  Objective:  Physical Exam: warm, good capillary refill, no trophic changes or ulcerative lesions, normal DP and PT pulses, and normal sensory exam. Right Foot: He has hallux valgus deformity with mild pain over the medial eminence and with joint range of motion which is limited  Radiographs: Multiple views x-ray of the right foot: Severe hallux valgus with metatarsus adductus deformity and osteoarthritis of the first metatarsal phalangeal joint Assessment:   1. Acquired hallux valgus of right foot   2. Congenital metatarsus adductus, right foot      Plan:  Patient was evaluated and treated and all questions answered.   Discussed etiology and treatment options of hallux valgus deformity with the patient.  I did review the x-rays with him and discussed that with his metatarsus adductus deformity Lapa plasty alone would likely not be sufficient he would probably require additional 1-3 tarsometatarsal fusions to completely correct the deformity.  I discussed with him as an alternative first metatarsophalangeal joint arthrodesis would likely give him a satisfactory result and allow him to stay active and do his normal activities.  He does not do any activities require maximal dorsiflexion of the hallux.  I think this will probably the best outcome for him to correct the  deformity with a single surgery and eliminate the arthritic changes he already has in his first metatarsal phalangeal joint.  For now he will continue to monitor this and treated nonsurgically which we discussed several ways to do this including wider shoe gear.  He will return to see me as needed when he is ready for surgical planning  Return if symptoms worsen or fail to improve or ready to schedule surgery.

## 2021-06-18 ENCOUNTER — Other Ambulatory Visit: Payer: Self-pay | Admitting: Cardiovascular Disease

## 2021-07-15 ENCOUNTER — Encounter: Payer: Self-pay | Admitting: Internal Medicine

## 2021-07-29 NOTE — Progress Notes (Signed)
Cardiology Office Note:   Date:  07/30/2021  NAME:  Roger Orozco    MRN: [id number] DOB:  [birth year]   PCP:  Roger Stabile, MD  Cardiologist:  None  Electrophysiologist:  None   Referring MD: Roger Stabile, MD   Chief Complaint  Patient presents with   Follow-up   History of Present Illness:   Roger Orozco is a 60 y.o. male with a hx of ascending aortic aneurysm, HTN, HLD who presents for follow-up. Asc Ao 45 mm. Plans to repeat CTA in February 2023.  He reports his blood pressure has been elevated over the past several visits to his primary care physician.  BP 150/80.  He reports he has been quite active.  There was some confusion about his activity level.  He does have an ascending aortic aneurysm but he can be active.  I have just recommended against maximum lifting.  I do not believe he will become a power lifter in the near future.  I would encourage him to become a little more active and we discussed this in the office today.  I did apologize about the confusion.  Regarding his blood pressure is a bit elevated.  Given his aortic aneurysm he needs to have this lowered.  CT scan also showed coronary calcifications.  I recommended to increasing his Crestor to 40 mg daily.  His most recent LDL is 86.  Would like this a bit lower.  He is okay to do this.  He denies any chest pain or trouble breathing.  He has had no further palpitations.  He is diabetic but working with his primary care physician on this.  He overall appears to be doing well.  Just need to get his blood pressure under control.  Problem List 1. DM -A1c 6.5 2. HTN 3. HLD -T chol 155, LDL 86, TG 171, HDL 40 4. Obesity -BMI 33 5. Thoracic aortic aneurysm  -45 mm 10/03/2020 6. Coronary calcifications  -moderate to severe coronary calcifications   Past Medical History: Past Medical History:  Diagnosis Date   Gout    Hyperlipidemia    Hypertension    Renal disorder    kidney stones    Thyroid disease     Past  Surgical History: Past Surgical History:  Procedure Laterality Date   BACK SURGERY     KNEE SURGERY     when pt was 60 years old     Current Medications: Current Meds  Medication Sig   allopurinol (ZYLOPRIM) 300 MG tablet Take 300 mg by mouth daily.   amLODipine (NORVASC) 5 MG tablet Take 1 tablet (5 mg total) by mouth daily.   benzonatate (TESSALON) 200 MG capsule Take 200 mg by mouth 3 (three) times daily.   finasteride (PROSCAR) 5 MG tablet Take 5 mg by mouth daily.   levothyroxine (SYNTHROID) 100 MCG tablet Take 100 mcg by mouth every morning.   olmesartan (BENICAR) 40 MG tablet TAKE ONE TABLET (40MG  TOTAL) BY MOUTH DAILY   tamsulosin (FLOMAX) 0.4 MG CAPS capsule Take 0.4 mg by mouth at bedtime.     Allergies:    Patient has no known allergies.   Social History: Social History   Socioeconomic History   Marital status: Married    Spouse name: Not on file   Number of children: 1   Years of education: Not on file   Highest education level: Not on file  Occupational History   Occupation:  Tobacco Use  Smoking status: Never   Smokeless tobacco: Never  Substance and Sexual Activity   Alcohol use: Yes    Comment: occ   Drug use: Never   Sexual activity: Not on file  Other Topics Concern   Not on file  Social History Narrative   Not on file   Social Determinants of Health   Financial Resource Strain: Not on file  Food Insecurity: Not on file  Transportation Needs: Not on file  Physical Activity: Not on file  Stress: Not on file  Social Connections: Not on file     Family History: The patient's family history includes Heart disease in his sister; Hypertension in his mother.  ROS:   All other ROS reviewed and negative. Pertinent positives noted in the HPI.     EKGs/Labs/Other Studies Reviewed:   The following studies were personally reviewed by me today:   CTA 10/03/2020 Thoracic aortic aneurysm with maximal transaxial diameter of 4.5  cm within the ascending segment. Recommend semi-annual imaging followup by CTA or MRA and referral to cardiothoracic surgery if not already obtained. This recommendation follows 2010 ACCF/AHA/AATS/ACR/ASA/SCA/SCAI/SIR/STS/SVM Guidelines for the Diagnosis and Management of Patients With Thoracic Aortic Disease. Circulation. 2010; 121ML:4928372. Aortic aneurysm NOS (ICD10-I71.9)  TTE 09/23/2020  1. Left ventricular ejection fraction, by estimation, is 65 to 70%. The  left ventricle has normal function. The left ventricle has no regional  wall motion abnormalities. There is moderate left ventricular hypertrophy.  Left ventricular diastolic  parameters are consistent with Grade I diastolic dysfunction (impaired  relaxation).   2. Right ventricular systolic function is normal. The right ventricular  size is normal.   3. The mitral valve is grossly normal. Trivial mitral valve  regurgitation.   4. The aortic valve is tricuspid. Aortic valve regurgitation is trivial.  Mild to moderate aortic valve sclerosis/calcification is present, without  any evidence of aortic stenosis.   5. Aortic dilatation noted. There is moderate to severe dilatation of the  ascending aorta, measuring 48 mm. There is mild dilatation of the aortic  root, measuring 39 mm.   6. The inferior vena cava is normal in size with greater than 50%  respiratory variability, suggesting right atrial pressure of 3 mmHg.   Recent Labs: 09/29/2020: BUN 18; Creatinine, Ser 1.03; Potassium 4.3; Sodium 145   Recent Lipid Panel No results found for: CHOL, TRIG, HDL, CHOLHDL, VLDL, LDLCALC, LDLDIRECT  Physical Exam:   VS:  BP (!) 150/80   Pulse 89   Ht 6\' 3"  (1.905 m)   Wt 257 lb (116.6 kg)   SpO2 93%   BMI 32.12 kg/m    Wt Readings from Last 3 Encounters:  07/30/21 257 lb (116.6 kg)  12/03/20 263 lb 3.2 oz (119.4 kg)  09/03/20 259 lb 12.8 oz (117.8 kg)    General: Well nourished, well developed, in no acute distress Head:  Atraumatic, normal size  Eyes: PEERLA, EOMI  Neck: Supple, no JVD Endocrine: No thryomegaly Cardiac: Normal S1, S2; RRR; no murmurs, rubs, or gallops Lungs: Clear to auscultation bilaterally, no wheezing, rhonchi or rales  Abd: Soft, nontender, no hepatomegaly  Ext: No edema, pulses 2+ Musculoskeletal: No deformities, BUE and BLE strength normal and equal Skin: Warm and dry, no rashes   Neuro: Alert and oriented to person, place, time, and situation, CNII-XII grossly intact, no focal deficits  Psych: Normal mood and affect   ASSESSMENT:   Roger Orozco is a 60 y.o. male who presents for the following: 1. Aneurysm  of ascending aorta without rupture   2. Primary hypertension   3. Mixed hyperlipidemia     PLAN:   1. Aneurysm of ascending aorta without rupture -Aortic aneurysm up to 45 mm on CT scan in February 2021.  He will be due for repeat imaging next year.  He reports he will lose health insurance for a bit of time and would like to get scans done if possible.  I recommend he just completed an echocardiogram in 4 to 6 months once his insurance is kicked in.  His data matches up closely with CT imaging. -He may exercise.  He can do whatever he likes.  We just discouraged maximum lifting.  He can do any activity up to 70% of maximum.  I think this is reasonable.  He has no other restrictions.  Exercise is actually good for his overall cardiovascular health.  We just want to avoid maximum lifting.  2. Primary hypertension -Blood pressure is elevated.  Would add amlodipine 5 mg daily.  Continue olmesartan 40 mg daily.  BP less than 130/80 is his goal.  He was also encouraged to pursue low-salt diet.  3. Mixed hyperlipidemia -Most recent LDL 86.  He is diabetic.  He has coronary calcification seen on CT imaging.  Would recommend we increase his Crestor to 40 daily.  Goal LDL is less than 70.  If he cannot tolerate higher dose of Crestor we would add Zetia.  He will let us know how it  goes.  Disposition: Return in about 1 year (around 07/30/2022).  Medication Adjustments/Labs and Tests Ordered: Current medicines are reviewed at length with the patient today.  Concerns regarding medicines are outlined above.  Orders Placed This Encounter  Procedures   ECHOCARDIOGRAM COMPLETE    Meds ordered this encounter  Medications   amLODipine (NORVASC) 5 MG tablet    Sig: Take 1 tablet (5 mg total) by mouth daily.    Dispense:  180 tablet    Refill:  3   rosuvastatin (CRESTOR) 40 MG tablet    Sig: Take 1 tablet (40 mg total) by mouth daily.    Dispense:  90 tablet    Refill:  3     Patient Instructions  Medication Instructions:  Start Amlodipine 5 mg daily  Increase Crestor to 40 mg daily   *If you need a refill on your cardiac medications before your next appointment, please call your pharmacy*  Testing/Procedures: Echocardiogram (6 month) - Your physician has requested that you have an echocardiogram. Echocardiography is a painless test that uses sound waves to create images of your heart. It provides your doctor with information about the size and shape of your heart and how well your heart's chambers and valves are working. This procedure takes approximately one hour. There are no restrictions for this procedure. This will be performed at either our [facility] location - [address], [place] location [employer] 2nd floor.    Follow-Up: At [facility], you and your health needs are our priority.  As part of our continuing mission to provide you with exceptional heart care, we have created designated Provider Care Teams.  These Care Teams include your primary Cardiologist (physician) and Advanced Practice Providers (APPs -  Physician Assistants and Nurse Practitioners) who all work together to provide you with the care you need, when you need it.  We recommend signing up for the patient portal called "MyChart".  Sign up  information is provided  on this After Visit Summary.  MyChart is used to connect with patients for Virtual Visits (Telemedicine).  Patients are able to view lab/test results, encounter notes, upcoming appointments, etc.  Non-urgent messages can be sent to your provider as well.   To learn more about what you can do with MyChart, go to [email or web address].    Your next appointment:   12 month(s)  The format for your next appointment:   In Person  Provider:   Eleonore Chiquito, MD     Time Spent with Patient: I have spent a total of 35 minutes with patient reviewing hospital notes, telemetry, EKGs, labs and examining the patient as well as establishing an assessment and plan that was discussed with the patient.  > 50% of time was spent in direct patient care.  Signed, Addison Naegeli. Audie Box, MD, [place]  [address], [place], [place] [address] [phone]  07/30/2021 9:57 AM

## 2021-07-30 ENCOUNTER — Encounter: Payer: Self-pay | Admitting: Cardiovascular Disease

## 2021-07-30 ENCOUNTER — Other Ambulatory Visit: Payer: Self-pay

## 2021-07-30 ENCOUNTER — Ambulatory Visit: Payer: Managed Care, Other (non HMO) | Admitting: Cardiovascular Disease

## 2021-07-30 VITALS — BP 150/80 | HR 89 | Ht 75.0 in | Wt 257.0 lb

## 2021-07-30 DIAGNOSIS — I10 Essential (primary) hypertension: Secondary | ICD-10-CM | POA: Diagnosis not present

## 2021-07-30 DIAGNOSIS — I7121 Aneurysm of the ascending aorta, without rupture: Secondary | ICD-10-CM | POA: Diagnosis not present

## 2021-07-30 DIAGNOSIS — E782 Mixed hyperlipidemia: Secondary | ICD-10-CM

## 2021-07-30 MED ORDER — AMLODIPINE BESYLATE 5 MG PO TABS: 5.0000 mg | ORAL_TABLET | Freq: Every day | ORAL | 3 refills | Status: DC

## 2021-07-30 MED ORDER — ROSUVASTATIN CALCIUM 40 MG PO TABS: 40.0000 mg | ORAL_TABLET | Freq: Every day | ORAL | 3 refills | Status: DC

## 2021-07-30 NOTE — Patient Instructions (Signed)
Medication Instructions:  Start Amlodipine 5 mg daily  Increase Crestor to 40 mg daily   *If you need a refill on your cardiac medications before your next appointment, please call your pharmacy*  Testing/Procedures: Echocardiogram (6 month) - Your physician has requested that you have an echocardiogram. Echocardiography is a painless test that uses sound waves to create images of your heart. It provides your doctor with information about the size and shape of your heart and how well your heart's chambers and valves are working. This procedure takes approximately one hour. There are no restrictions for this procedure. This will be performed at either our [facility] location - [address]  -or- [place] location [facility] 2nd floor.    Follow-Up: At [facility], you and your health needs are our priority.  As part of our continuing mission to provide you with exceptional heart care, we have created designated Provider Care Teams.  These Care Teams include your primary Cardiologist (physician) and Advanced Practice Providers (APPs -  Physician Assistants and Nurse Practitioners) who all work together to provide you with the care you need, when you need it.  We recommend signing up for the patient portal called "MyChart".  Sign up information is provided on this After Visit Summary.  MyChart is used to connect with patients for Virtual Visits (Telemedicine).  Patients are able to view lab/test results, encounter notes, upcoming appointments, etc.  Non-urgent messages can be sent to your provider as well.   To learn more about what you can do with MyChart, go to [email or web address].    Your next appointment:   12 month(s)  The format for your next appointment:   In Person  Provider:   Lennie Odor, MD

## 2021-08-21 ENCOUNTER — Emergency Department (HOSPITAL_COMMUNITY): Payer: Managed Care, Other (non HMO)

## 2021-08-21 ENCOUNTER — Other Ambulatory Visit: Payer: Self-pay

## 2021-08-21 ENCOUNTER — Encounter (HOSPITAL_COMMUNITY): Payer: Self-pay

## 2021-08-21 ENCOUNTER — Emergency Department (HOSPITAL_COMMUNITY)
Admission: EM | Admit: 2021-08-21 | Discharge: 2021-08-21 | Disposition: A | Discharge status: Home / Self Care | Payer: Managed Care, Other (non HMO) | Attending: Emergency Medicine | Admitting: Emergency Medicine

## 2021-08-21 DIAGNOSIS — I10 Essential (primary) hypertension: Secondary | ICD-10-CM | POA: Insufficient documentation

## 2021-08-21 DIAGNOSIS — N132 Hydronephrosis with renal and ureteral calculous obstruction: Secondary | ICD-10-CM | POA: Diagnosis not present

## 2021-08-21 DIAGNOSIS — Z79899 Other long term (current) drug therapy: Secondary | ICD-10-CM | POA: Insufficient documentation

## 2021-08-21 DIAGNOSIS — N201 Calculus of ureter: Secondary | ICD-10-CM

## 2021-08-21 DIAGNOSIS — R1032 Left lower quadrant pain: Secondary | ICD-10-CM | POA: Diagnosis present

## 2021-08-21 LAB — URINALYSIS, ROUTINE W REFLEX MICROSCOPIC
Bacteria, UA: NONE SEEN
Bilirubin Urine: NEGATIVE
Glucose, UA: 50 mg/dL — AB
Ketones, ur: 5 mg/dL — AB
Leukocytes,Ua: NEGATIVE
Nitrite: NEGATIVE
Protein, ur: NEGATIVE mg/dL
Specific Gravity, Urine: 1.017 (ref 1.005–1.030)
pH: 5 (ref 5.0–8.0)

## 2021-08-21 LAB — CBC WITH DIFFERENTIAL/PLATELET
Abs Immature Granulocytes: 0.03 10*3/uL (ref 0.00–0.07)
Basophils Absolute: 0 10*3/uL (ref 0.0–0.1)
Basophils Relative: 0 %
Eosinophils Absolute: 0.1 10*3/uL (ref 0.0–0.5)
Eosinophils Relative: 1 %
HCT: 46.9 % (ref 39.0–52.0)
Hemoglobin: 15.9 g/dL (ref 13.0–17.0)
Immature Granulocytes: 0 %
Lymphocytes Relative: 8 %
Lymphs Abs: 0.8 10*3/uL (ref 0.7–4.0)
MCH: 30.8 pg (ref 26.0–34.0)
MCHC: 33.9 g/dL (ref 30.0–36.0)
MCV: 90.9 fL (ref 80.0–100.0)
Monocytes Absolute: 0.9 10*3/uL (ref 0.1–1.0)
Monocytes Relative: 9 %
Neutro Abs: 8.1 10*3/uL — ABNORMAL HIGH (ref 1.7–7.7)
Neutrophils Relative %: 82 %
Platelets: 217 10*3/uL (ref 150–400)
RBC: 5.16 MIL/uL (ref 4.22–5.81)
RDW: 13.1 % (ref 11.5–15.5)
WBC: 10 10*3/uL (ref 4.0–10.5)
nRBC: 0 % (ref 0.0–0.2)

## 2021-08-21 LAB — BASIC METABOLIC PANEL
Anion gap: 10 (ref 5–15)
BUN: 21 mg/dL (ref 8–23)
CO2: 25 mmol/L (ref 22–32)
Calcium: 9.4 mg/dL (ref 8.9–10.3)
Chloride: 102 mmol/L (ref 98–111)
Creatinine, Ser: 1.67 mg/dL — ABNORMAL HIGH (ref 0.61–1.24)
GFR, Estimated: 46 mL/min — ABNORMAL LOW (ref >60)
Glucose, Bld: 181 mg/dL — ABNORMAL HIGH (ref 70–99)
Potassium: 4.3 mmol/L (ref 3.5–5.1)
Sodium: 137 mmol/L (ref 135–145)

## 2021-08-21 MED ORDER — OXYCODONE-ACETAMINOPHEN 5-325 MG PO TABS: 1.0000 | ORAL_TABLET | Freq: Three times a day (TID) | ORAL | 0 refills | Status: AC | PRN

## 2021-08-21 MED ORDER — ONDANSETRON HCL 4 MG PO TABS: 4.0000 mg | ORAL_TABLET | Freq: Four times a day (QID) | ORAL | 0 refills | Status: DC

## 2021-08-21 NOTE — ED Provider Notes (Signed)
[facility] EMERGENCY DEPARTMENT Provider Note   CSN: [id number] Arrival date & time: 08/21/21  1223     History Chief Complaint  Patient presents with   Flank Pain    Roger Orozco is a 60 y.o. male.  HPI  Patient with medical history including gout, hypertension, kidney stones, ascending aortic aneurysm 4.5 cm as of February 2021 presents  with chief complaint of left-sided flank pain.  Patient states this started 2 days ago, came on suddenly while he was walking around, describes the pain as a sharp-like sensation which waxes and wanes intensity, pain will remain in his left flank radiating down his left lower quadrant, he has associated urinary frequency as well as decrease in urination, he denies dysuria, hematuria, denies stomach pains, nausea, vomiting, diarrhea.  He has no fevers, chills, chest pain, shortness of breath, general body aches.  He states this feels like his stable kidney stones.  His only complaints this time, denies any leaving or aggravating factors.  Past Medical History:  Diagnosis Date   Gout    Hyperlipidemia    Hypertension    Renal disorder    kidney stones    Thyroid disease     There are no problems to display for this patient.   Past Surgical History:  Procedure Laterality Date   BACK SURGERY     KNEE SURGERY     when pt was 60 years old        Family History  Problem Relation Age of Onset   Hypertension Mother    Heart disease Sister        pulmonary hypertension    Social History   Tobacco Use   Smoking status: Never   Smokeless tobacco: Never  Substance Use Topics   Alcohol use: Yes    Comment: occ   Drug use: Never    Home Medications Prior to Admission medications   Medication Sig Start Date End Date Taking? Authorizing Provider  ondansetron (ZOFRAN) 4 MG tablet Take 1 tablet (4 mg total) by mouth every 6 (six) hours. 08/21/21  Yes Marcello Fennel, PA-C  oxyCODONE-acetaminophen (PERCOCET/ROXICET) 5-325 MG  tablet Take 1 tablet by mouth every 8 (eight) hours as needed for up to 2 days for severe pain. 08/21/21 08/23/21 Yes Marcello Fennel, PA-C  oxyCODONE-acetaminophen (PERCOCET/ROXICET) 5-325 MG tablet Take 1 tablet by mouth every 8 (eight) hours as needed for up to 2 days for severe pain. 08/21/21 08/23/21 Yes Marcello Fennel, PA-C  allopurinol (ZYLOPRIM) 300 MG tablet Take 300 mg by mouth daily. 06/30/20   [provider]  amLODipine (NORVASC) 5 MG tablet Take 1 tablet (5 mg total) by mouth daily. 07/30/21 10/28/21  O'NealCassie Freer, MD  benzonatate (TESSALON) 200 MG capsule Take 200 mg by mouth 3 (three) times daily. 02/24/21   [provider]  dexamethasone (DECADRON) 4 MG tablet Take 6 mg by mouth daily. Patient not taking: Reported on 07/30/2021 02/24/21   [provider]  Dutasteride-Tamsulosin HCl 0.5-0.4 MG CAPS Take by mouth. Patient not taking: Reported on 07/30/2021    [provider]  finasteride (PROSCAR) 5 MG tablet Take 5 mg by mouth daily. 06/30/20   [provider]  levothyroxine (SYNTHROID) 100 MCG tablet Take 100 mcg by mouth every morning. 06/30/20   [provider]  olmesartan (BENICAR) 40 MG tablet TAKE ONE TABLET (40MG  TOTAL) BY MOUTH DAILY 06/18/21   O'Neal, Cassie Freer, MD  rosuvastatin (CRESTOR) 40 MG tablet Take 1  tablet (40 mg total) by mouth daily. 07/30/21 10/28/21  O'NealCassie Freer, MD  tamsulosin (FLOMAX) 0.4 MG CAPS capsule Take 0.4 mg by mouth at bedtime. 01/05/21   [provider]    Allergies    Patient has no known allergies.  Review of Systems   Review of Systems  Constitutional:  Negative for chills and fever.  HENT:  Negative for congestion.   Respiratory:  Negative for shortness of breath.   Cardiovascular:  Negative for chest pain.  Gastrointestinal:  Positive for abdominal pain. Negative for nausea and vomiting.  Genitourinary:  Positive for decreased urine volume, difficulty urinating,  flank pain and frequency. Negative for enuresis.  Musculoskeletal:  Negative for back pain.  Skin:  Negative for rash.  Neurological:  Negative for dizziness.  Hematological:  Does not bruise/bleed easily.   Physical Exam Updated Vital Signs BP 126/83 (BP Location: Right Arm)    Pulse 92    Temp 98.6 F (37 C) (Oral)    Resp 18    Ht 6\' 2"  (1.88 m)    Wt 116.6 kg    SpO2 95%    BMI 33.00 kg/m   Physical Exam Vitals and nursing note reviewed.  Constitutional:      General: He is not in acute distress.    Appearance: He is not ill-appearing.  HENT:     Head: Normocephalic and atraumatic.     Nose: No congestion.  Eyes:     Conjunctiva/sclera: Conjunctivae normal.  Cardiovascular:     Rate and Rhythm: Normal rate and regular rhythm.     Pulses: Normal pulses.     Heart sounds: No murmur heard.   No friction rub. No gallop.  Pulmonary:     Effort: No respiratory distress.     Breath sounds: No wheezing, rhonchi or rales.  Abdominal:     Palpations: Abdomen is soft.     Tenderness: There is abdominal tenderness. There is left CVA tenderness. There is no right CVA tenderness.     Comments: Abdomen nondistended, normal bowel sounds, dull to percussion, he is slight tenderness palpation in his left side, as well as left CVA tenderness, there is no guarding, rebound tenderness, peritoneal sign.  Musculoskeletal:     Right lower leg: No edema.     Left lower leg: No edema.  Skin:    General: Skin is warm and dry.  Neurological:     Mental Status: He is alert.  Psychiatric:        Mood and Affect: Mood normal.    ED Results / Procedures / Treatments   Labs (all labs ordered are listed, but only abnormal results are displayed) Labs Reviewed  URINALYSIS, ROUTINE W REFLEX MICROSCOPIC - Abnormal; Notable for the following components:      Result Value   Glucose, UA 50 (*)    Hgb urine dipstick MODERATE (*)    Ketones, ur 5 (*)    All other components within normal limits   BASIC METABOLIC PANEL - Abnormal; Notable for the following components:   Glucose, Bld 181 (*)    Creatinine, Ser 1.67 (*)    GFR, Estimated 46 (*)    All other components within normal limits  CBC WITH DIFFERENTIAL/PLATELET - Abnormal; Notable for the following components:   Neutro Abs 8.1 (*)    All other components within normal limits    EKG None  Radiology CT Renal Stone Study  Result Date: 08/21/2021 CLINICAL DATA:  Left flank pain EXAM:  CT ABDOMEN AND PELVIS WITHOUT CONTRAST TECHNIQUE: Multidetector CT imaging of the abdomen and pelvis was performed following the standard protocol without IV contrast. COMPARISON:  10/23/2010 FINDINGS: Lower chest: Small linear densities in the left lower lung fields may suggest scarring or subsegmental atelectasis. There are scattered coronary artery calcifications. Minimal pericardial effusion is present Hepatobiliary: Surgical clips are seen in gallbladder fossa. There is no dilation of bile ducts. There are small calcific densities adjacent to the inferior aspect of right lobe of liver and in gallbladder fossa. Possibility of dropped gallstones is not excluded. Similar finding was seen in the previous examination. Pancreas: No focal abnormality is seen. Spleen: Unremarkable. Adrenals/Urinary Tract: Adrenals are unremarkable. There is mild to moderate left hydronephrosis. There is 10 mm calculus in the proximal left ureter close to the ureteropelvic junction. There is mild left perinephric stranding extending to the left iliac fossa. This may be related to ureteric obstruction or superimposed pyelonephritis. There are few tiny 1-2 mm calculi in both kidneys. Urinary bladder is not distended. Stomach/Bowel: Stomach is unremarkable. Small bowel loops are not dilated. Appendix is not dilated. There is no significant wall thickening in colon. Scattered diverticula are seen in colon without signs of focal diverticulitis. Vascular/Lymphatic: There are scattered  arterial calcifications. Reproductive: Prostate is enlarged measuring 7.3 x 7.1 by 6.5 cm Other: There is no ascites or pneumoperitoneum. Small left inguinal hernia containing fat is seen. Musculoskeletal: There is minimal anterolisthesis at L4-L5 level. Degenerative changes are noted with bony spurs and facet hypertrophy causing spinal stenosis and encroachment of neural foramina at multiple levels. IMPRESSION: There is mild to moderate left hydronephrosis caused by 10 mm calculus in the proximal left ureter close to the ureteropelvic junction. There are few tiny bilateral renal stones. There is left perinephric stranding extending to the left iliac fossa, possibly related to ureteric obstruction or superimposed pyelonephritis. There is no evidence of intestinal obstruction or pneumoperitoneum. Appendix is not dilated. Diverticulosis of colon without signs of focal diverticulitis. There are scattered coronary artery calcifications. There are few small calcific densities in gallbladder fossa and adjacent to the right lobe of liver which may suggest granulomas or dropped gallstones. There is no adjacent inflammation. Enlarged prostate.  Lumbar spondylosis. Other findings as described in the body of the report. Electronically Signed   By: Ernie Avena M.D.   On: 08/21/2021 17:23    Procedures Procedures   Medications Ordered in ED Medications - No data to display  ED Course  I have reviewed the triage vital signs and the nursing notes.  Pertinent labs & imaging results that were available during my care of the patient were reviewed by me and considered in my medical decision making (see chart for details).    MDM Rules/Calculators/A&P                         Initial impression-presents with left-sided flank pain.  He is alert, no acute stress, vital signs reassuring.  Concern for possible kidney stone will obtain basic lab work-up UA CT imaging and reassess.  Work-up-CBC is unremarkable, BMP  shows glucose of 181, creatinine of 1.67 above his baseline of 1.2, UA shows negative nitrates or leukocytes, shows hematuria present.  CT renal reveals a 10 mm stone in the proximal left ureter to the ureteropelvic junction there is noted mild left hydronephrosis.  With [employer] phrenic stranding.  Reassessment-there is noted that patient had a 10 mm stone, unlikely she will pass on  its own, will consult with urology for further recommendations.  Consult-spoke with Dr. Alyson Ingles of urology, he recommends providing patient with pain management, and have him follow-up with his office on Tuesday, he does not recommend antibiotics at this time.  Rule out-I have low suspicion for systemic infection as patient nontoxic-appearing, vital signs reassuring, no leukocytosis present.  I have low suspicion for UTI, Pilo, septic stone as UA is negative for signs of infection, negative nitrates or leukocytes, no white blood cells present.  There is noted stranding seen on CT scan by suspect this is secondary due to obstruction by stone.  I have low suspicion for AAA as presentation is atypical of etiology, i.e. there is no bulging mass, vital signs reassuring, he is nondiaphoretic, presentation is more from this 10 mm stone.  Plan-  Left-sided flank pain-likely secondary due to 10 mm stone, will provide him with pain medications, have him continue with Flomax,  Zofran, given strict return precautions, he will follow-up with urology on Tuesday.  Vital signs have remained stable, no indication for hospital admission.   Patient given at home care as well strict return precautions.  Patient verbalized that they understood agreed to said plan.     Final Clinical Impression(s) / ED Diagnoses Final diagnoses:  Ureterolithiasis    Rx / DC Orders ED Discharge Orders          Ordered    oxyCODONE-acetaminophen (PERCOCET/ROXICET) 5-325 MG tablet  Every 8 hours PRN        08/21/21 1821    ondansetron (ZOFRAN) 4 MG  tablet  Every 6 hours        08/21/21 1823    oxyCODONE-acetaminophen (PERCOCET/ROXICET) 5-325 MG tablet  Every 8 hours PRN        08/21/21 1823             Aron Baba 08/21/21 Christy Gentles, MD 08/22/21 [phone]

## 2021-08-21 NOTE — Discharge Instructions (Signed)
You have a 10 mm stone in your left ureter likely causing her pain.  Please continue stay hydrated and taking your Flomax.I have given you a short course of narcotics please take as prescribed.  This medication can make you drowsy do not consume alcohol or operate heavy machinery when taking this medication.  This medication is Tylenol in it do not take Tylenol and take this medication.  3  Please follow-up with urology on Tuesday, have given you contact information call their office to schedule your appointment.  Come back if your symptoms worsen, you have fevers, chills, worsening pain despite medication use, unable to urinate, nausea, vomiting as you will need to be reevaluated.

## 2021-08-21 NOTE — ED Triage Notes (Signed)
Pt presents to ED with complaints of left flank pain started 2 days ago. Denies urinary symptoms

## 2021-08-24 MED FILL — Oxycodone w/ Acetaminophen Tab 5-325 MG: ORAL | Qty: 6 | Status: AC

## 2021-08-27 ENCOUNTER — Ambulatory Visit (HOSPITAL_COMMUNITY)
Admission: RE | Admit: 2021-08-27 | Discharge: 2021-08-27 | Disposition: A | Discharge status: Home / Self Care | Payer: Managed Care, Other (non HMO) | Source: Ambulatory Visit | Attending: Urology | Admitting: Urology

## 2021-08-27 ENCOUNTER — Encounter: Payer: Self-pay | Admitting: Urology

## 2021-08-27 ENCOUNTER — Ambulatory Visit: Payer: Managed Care, Other (non HMO) | Admitting: Urology

## 2021-08-27 ENCOUNTER — Other Ambulatory Visit: Payer: Self-pay

## 2021-08-27 VITALS — BP 119/74 | HR 108 | Ht 70.0 in | Wt 257.0 lb

## 2021-08-27 DIAGNOSIS — N201 Calculus of ureter: Secondary | ICD-10-CM

## 2021-08-27 LAB — URINALYSIS, ROUTINE W REFLEX MICROSCOPIC
Bilirubin, UA: NEGATIVE
Ketones, UA: NEGATIVE
Leukocytes,UA: NEGATIVE
Nitrite, UA: NEGATIVE
Protein,UA: NEGATIVE
Specific Gravity, UA: 1.025 (ref 1.005–1.030)
Urobilinogen, Ur: 0.2 mg/dL (ref 0.2–1.0)
pH, UA: 5 (ref 5.0–7.5)

## 2021-08-27 LAB — MICROSCOPIC EXAMINATION
Bacteria, UA: NONE SEEN
Epithelial Cells (non renal): NONE SEEN /hpf (ref 0–10)
Renal Epithel, UA: NONE SEEN /hpf
WBC, UA: NONE SEEN /hpf (ref 0–5)

## 2021-08-27 MED ORDER — OXYCODONE HCL 5 MG PO TABS: 5.0000 mg | ORAL_TABLET | Freq: Four times a day (QID) | ORAL | 0 refills | Status: DC | PRN

## 2021-08-27 NOTE — Progress Notes (Signed)
Assessment: 1. Ureteral calculus, left     Plan: I personally reviewed the CT study from 08/21/2021 showing a 6 x 10 mm proximal left ureteral calculus with obstructive changes. Recommend further evaluation with a KUB given possibility of a uric acid stone. Refill of pain medicine provided. Continue tamsulosin. Strain urine. We will contact him with results of KUB. Options for management discussed including shockwave lithotripsy versus ureteroscopic stone manipulation versus urinary alkalinization if uric acid stone.  Chief Complaint:  Chief Complaint  Patient presents with   ureteral calculus    History of Present Illness:  Roger Orozco is a 61 y.o. year old male who is seen in consultation from Celene Squibb, MD for evaluation of a left ureteral calculus.  He was seen in the emergency room on 08/21/2021 with left flank pain.  He noted onset of left flank pain approximately 2 days before presenting to the emergency room.  No fevers, chills, nausea, or vomiting.  No dysuria or gross hematuria.  CT imaging demonstrated a 10 mm calculus in the proximal left ureter near the UPJ with mild to moderate left hydronephrosis.  He has been taking pain medication intermittently.  No pain today. He has a prior history of kidney stones with stone episodes approximately every 5 years.  He thinks he may have had uric acid stones in the past.  He previously underwent ureteroscopic stone manipulation and lithotripsy.   Past Medical History:  Past Medical History:  Diagnosis Date   Gout    Hyperlipidemia    Hypertension    Renal disorder    kidney stones    Thyroid disease     Past Surgical History:  Past Surgical History:  Procedure Laterality Date   BACK SURGERY     KNEE SURGERY     when pt was 60 years old     Allergies:  No Known Allergies  Family History:  Family History  Problem Relation Age of Onset   Hypertension Mother    Heart disease Sister        pulmonary  hypertension    Social History:  Social History   Tobacco Use   Smoking status: Never   Smokeless tobacco: Never  Substance Use Topics   Alcohol use: Yes    Comment: occ   Drug use: Never    Review of symptoms:  Constitutional:  Negative for unexplained weight loss, night sweats, fever, chills ENT:  Negative for nose bleeds, sinus pain, painful swallowing CV:  Negative for chest pain, shortness of breath, exercise intolerance, palpitations, loss of consciousness Resp:  Negative for cough, wheezing, shortness of breath GI:  Negative for nausea, vomiting, diarrhea, bloody stools GU:  Positives noted in HPI; otherwise negative for gross hematuria, dysuria, urinary incontinence Neuro:  Negative for seizures, poor balance, limb weakness, slurred speech Psych:  Negative for lack of energy, depression, anxiety Endocrine:  Negative for polydipsia, polyuria, symptoms of hypoglycemia (dizziness, hunger, sweating) Hematologic:  Negative for anemia, purpura, petechia, prolonged or excessive bleeding, use of anticoagulants  Allergic:  Negative for difficulty breathing or choking as a result of exposure to anything; no shellfish allergy; no allergic response (rash/itch) to materials, foods  Physical exam: BP 119/74 (BP Location: Left Arm)    Pulse (!) 108    Ht 5\' 10"  (1.778 m)    Wt 257 lb (116.6 kg)    BMI 36.88 kg/m  GENERAL APPEARANCE:  Well appearing, well developed, well nourished, NAD HEENT: Atraumatic, Normocephalic, oropharynx clear. NECK:  Supple without lymphadenopathy or thyromegaly. LUNGS: Clear to auscultation bilaterally. HEART: Regular Rate and Rhythm without murmurs, gallops, or rubs. ABDOMEN: Soft, non-tender, No Masses. EXTREMITIES: Moves all extremities well.  Without clubbing, cyanosis, or edema. NEUROLOGIC:  Alert and oriented x 3, normal gait, CN II-XII grossly intact.  MENTAL STATUS:  Appropriate. BACK:  Non-tender to palpation.  No CVAT SKIN:  Warm, dry and intact.     Results: U/A:  pH 5.0, 3-10 RBC

## 2021-08-27 NOTE — Progress Notes (Signed)
Urological Symptom Review ° °Patient is experiencing the following symptoms: °Stream starts and stops °Weak stream °Erection problems (male only) ° ° °Review of Systems ° °Gastrointestinal (upper)  : °Negative for upper GI symptoms ° °Gastrointestinal (lower) : °Negative for lower GI symptoms ° °Constitutional : °Negative for symptoms ° °Skin: °Negative for skin symptoms ° °Eyes: °Negative for eye symptoms ° °Ear/Nose/Throat : °Negative for Ear/Nose/Throat symptoms ° °Hematologic/Lymphatic: °Negative for Hematologic/Lymphatic symptoms ° °Cardiovascular : °Negative for cardiovascular symptoms ° °Respiratory : °Negative for respiratory symptoms ° °Endocrine: °Negative for endocrine symptoms ° °Musculoskeletal: °Negative for musculoskeletal symptoms ° °Neurological: °Negative for neurological symptoms ° °Psychologic: °Negative for psychiatric symptoms ° °

## 2021-08-27 NOTE — H&P (View-Only) (Signed)
Assessment: 1. Ureteral calculus, left     Plan: I personally reviewed the CT study from 08/21/2021 showing a 6 x 10 mm proximal left ureteral calculus with obstructive changes. Recommend further evaluation with a KUB given possibility of a uric acid stone. Refill of pain medicine provided. Continue tamsulosin. Strain urine. We will contact him with results of KUB. Options for management discussed including shockwave lithotripsy versus ureteroscopic stone manipulation versus urinary alkalinization if uric acid stone.  Chief Complaint:  Chief Complaint  Patient presents with   ureteral calculus    History of Present Illness:  Roger Orozco is a 61 y.o. year old male who is seen in consultation from Celene Squibb, MD for evaluation of a left ureteral calculus.  He was seen in the emergency room on 08/21/2021 with left flank pain.  He noted onset of left flank pain approximately 2 days before presenting to the emergency room.  No fevers, chills, nausea, or vomiting.  No dysuria or gross hematuria.  CT imaging demonstrated a 10 mm calculus in the proximal left ureter near the UPJ with mild to moderate left hydronephrosis.  He has been taking pain medication intermittently.  No pain today. He has a prior history of kidney stones with stone episodes approximately every 5 years.  He thinks he may have had uric acid stones in the past.  He previously underwent ureteroscopic stone manipulation and lithotripsy.   Past Medical History:  Past Medical History:  Diagnosis Date   Gout    Hyperlipidemia    Hypertension    Renal disorder    kidney stones    Thyroid disease     Past Surgical History:  Past Surgical History:  Procedure Laterality Date   BACK SURGERY     KNEE SURGERY     when pt was 61 years old     Allergies:  No Known Allergies  Family History:  Family History  Problem Relation Age of Onset   Hypertension Mother    Heart disease Sister        pulmonary  hypertension    Social History:  Social History   Tobacco Use   Smoking status: Never   Smokeless tobacco: Never  Substance Use Topics   Alcohol use: Yes    Comment: occ   Drug use: Never    Review of symptoms:  Constitutional:  Negative for unexplained weight loss, night sweats, fever, chills ENT:  Negative for nose bleeds, sinus pain, painful swallowing CV:  Negative for chest pain, shortness of breath, exercise intolerance, palpitations, loss of consciousness Resp:  Negative for cough, wheezing, shortness of breath GI:  Negative for nausea, vomiting, diarrhea, bloody stools GU:  Positives noted in HPI; otherwise negative for gross hematuria, dysuria, urinary incontinence Neuro:  Negative for seizures, poor balance, limb weakness, slurred speech Psych:  Negative for lack of energy, depression, anxiety Endocrine:  Negative for polydipsia, polyuria, symptoms of hypoglycemia (dizziness, hunger, sweating) Hematologic:  Negative for anemia, purpura, petechia, prolonged or excessive bleeding, use of anticoagulants  Allergic:  Negative for difficulty breathing or choking as a result of exposure to anything; no shellfish allergy; no allergic response (rash/itch) to materials, foods  Physical exam: BP 119/74 (BP Location: Left Arm)    Pulse (!) 108    Ht 5\' 10"  (1.778 m)    Wt 257 lb (116.6 kg)    BMI 36.88 kg/m  GENERAL APPEARANCE:  Well appearing, well developed, well nourished, NAD HEENT: Atraumatic, Normocephalic, oropharynx clear. NECK:  Supple without lymphadenopathy or thyromegaly. LUNGS: Clear to auscultation bilaterally. HEART: Regular Rate and Rhythm without murmurs, gallops, or rubs. ABDOMEN: Soft, non-tender, No Masses. EXTREMITIES: Moves all extremities well.  Without clubbing, cyanosis, or edema. NEUROLOGIC:  Alert and oriented x 3, normal gait, CN II-XII grossly intact.  MENTAL STATUS:  Appropriate. BACK:  Non-tender to palpation.  No CVAT SKIN:  Warm, dry and intact.     Results: U/A:  pH 5.0, 3-10 RBC

## 2021-08-28 ENCOUNTER — Other Ambulatory Visit: Payer: Self-pay | Admitting: Urology

## 2021-08-28 ENCOUNTER — Other Ambulatory Visit: Payer: Self-pay

## 2021-08-28 DIAGNOSIS — N201 Calculus of ureter: Secondary | ICD-10-CM

## 2021-08-28 NOTE — Progress Notes (Signed)
[facility] [place] Surgery Posting Form  Surgery Date/Time: 09/01/2021  Surgeon: Dr. Wilkie Aye  [facility] Location- [facility]  Diagnosis: left ureteral calculus  CPT: [phone]  Surgery: extracorporeal shockwave lithotripsy (left side)  Orders entered in Epic: 08/28/2021  Case Booked in Epic: 08/28/2021  Notified patient of surgery date: 08/28/2021  Does not require prior authorization

## 2021-08-28 NOTE — Progress Notes (Signed)
Surgical Physician Order [facility] [place]  * Scheduling expectation :  09/01/21  *Length of Case: 60 minutes  *MD Preforming Case: Wilkie Aye, MD  *Assistant Needed: no  *Facility Preference: [facility]  *Clearance needed: no  *Anticoagulation Instructions: N/A  *Aspirin Instructions: N/A  -Admit type: OUTpatient  -Anesthesia: Local  -Use Standing Orders: ESWL  *Diagnosis: Left Ureteral Stone  *Procedure: left  ESL  Additional orders: N/A  -Equipment:  none -VTE Prophylaxis Standing Order SCDs       Other:   -Standing Lab Orders Per Anesthesia    Lab other: KUB day of Procedure  -Standing Test orders EKG/Chest x-ray per Anesthesia       Test other:   - Medications:   none  -Other orders:  N/A  *Post-op visit Date/Instructions:  1-2 week with KUB prior

## 2021-08-31 ENCOUNTER — Other Ambulatory Visit: Payer: Self-pay

## 2021-08-31 ENCOUNTER — Encounter (HOSPITAL_COMMUNITY)
Admission: RE | Admit: 2021-08-31 | Discharge: 2021-08-31 | Disposition: A | Discharge status: Home / Self Care | Payer: Managed Care, Other (non HMO) | Source: Ambulatory Visit | Attending: Urology | Admitting: Urology

## 2021-08-31 ENCOUNTER — Encounter (HOSPITAL_COMMUNITY): Payer: Self-pay

## 2021-08-31 HISTORY — DX: Aortic aneurysm of unspecified site, without rupture: I71.9

## 2021-08-31 HISTORY — DX: Personal history of urinary calculi: Z87.442

## 2021-08-31 NOTE — Progress Notes (Signed)
°   08/31/21 0846  OBSTRUCTIVE SLEEP APNEA  Have you ever been diagnosed with sleep apnea through a sleep study? No  Do you snore loudly (loud enough to be heard through closed doors)?  0  Do you often feel tired, fatigued, or sleepy during the daytime (such as falling asleep during driving or talking to someone)? 0  Has anyone observed you stop breathing during your sleep? 0  Do you have, or are you being treated for high blood pressure? 1  BMI more than 35 kg/m2? 1  Age > 50 (1-yes) 1  Neck circumference greater than:Male 16 inches or larger, Male 17inches or larger? 0  Male Gender (Yes=1) 1  Obstructive Sleep Apnea Score 4  Score 5 or greater  Results sent to PCP

## 2021-09-01 ENCOUNTER — Encounter (HOSPITAL_COMMUNITY): Payer: Self-pay | Admitting: Urology

## 2021-09-01 ENCOUNTER — Encounter (HOSPITAL_COMMUNITY)
Admission: RE | Disposition: A | Discharge status: Home Health | Payer: Self-pay | Source: Home / Self Care | Attending: Urology

## 2021-09-01 ENCOUNTER — Ambulatory Visit (HOSPITAL_COMMUNITY): Payer: Managed Care, Other (non HMO)

## 2021-09-01 ENCOUNTER — Other Ambulatory Visit: Payer: Self-pay

## 2021-09-01 ENCOUNTER — Ambulatory Visit (HOSPITAL_COMMUNITY)
Admission: RE | Admit: 2021-09-01 | Discharge: 2021-09-01 | Disposition: A | Discharge status: Home Health | Payer: Managed Care, Other (non HMO) | Attending: Urology | Admitting: Urology

## 2021-09-01 DIAGNOSIS — N201 Calculus of ureter: Secondary | ICD-10-CM

## 2021-09-01 DIAGNOSIS — Z79899 Other long term (current) drug therapy: Secondary | ICD-10-CM | POA: Diagnosis not present

## 2021-09-01 HISTORY — PX: EXTRACORPOREAL SHOCK WAVE LITHOTRIPSY: SHX1557

## 2021-09-01 SURGERY — LITHOTRIPSY, ESWL
Anesthesia: LOCAL | Laterality: Left

## 2021-09-01 MED ORDER — DIPHENHYDRAMINE HCL 25 MG PO CAPS
ORAL_CAPSULE | ORAL | Status: AC
  Administered 2021-09-01: 25 mg via ORAL
  Filled 2021-09-01: qty 1

## 2021-09-01 MED ORDER — DIPHENHYDRAMINE HCL 25 MG PO CAPS: 25.0000 mg | ORAL_CAPSULE | ORAL | Status: AC

## 2021-09-01 MED ORDER — DIAZEPAM 5 MG PO TABS: 10.0000 mg | ORAL_TABLET | Freq: Once | ORAL | Status: AC

## 2021-09-01 MED ORDER — SODIUM CHLORIDE 0.9 % IV SOLN: Freq: Once | INTRAVENOUS | Status: AC

## 2021-09-01 MED ORDER — ONDANSETRON HCL 4 MG PO TABS: 4.0000 mg | ORAL_TABLET | Freq: Four times a day (QID) | ORAL | 0 refills | Status: AC

## 2021-09-01 MED ORDER — TAMSULOSIN HCL 0.4 MG PO CAPS: 0.4000 mg | ORAL_CAPSULE | Freq: Every day | ORAL | 1 refills | Status: DC

## 2021-09-01 MED ORDER — DIAZEPAM 5 MG PO TABS
ORAL_TABLET | ORAL | Status: AC
  Administered 2021-09-01: 10 mg via ORAL
  Filled 2021-09-01: qty 2

## 2021-09-01 MED ORDER — OXYCODONE HCL 5 MG PO TABS: 5.0000 mg | ORAL_TABLET | Freq: Four times a day (QID) | ORAL | 0 refills | Status: DC | PRN

## 2021-09-01 NOTE — Interval H&P Note (Signed)
History and Physical Interval Note:  09/01/2021 11:44 AM  Roger Orozco  has presented today for surgery, with the diagnosis of left ureteral stone.  The various methods of treatment have been discussed with the patient and family. After consideration of risks, benefits and other options for treatment, the patient has consented to  Procedure(s): EXTRACORPOREAL SHOCK WAVE LITHOTRIPSY (ESWL) (Left) as a surgical intervention.  The patient's history has been reviewed, patient examined, no change in status, stable for surgery.  I have reviewed the patient's chart and labs.  Questions were answered to the patient's satisfaction.     Wilkie Aye

## 2021-09-02 ENCOUNTER — Encounter (HOSPITAL_COMMUNITY): Payer: Self-pay | Admitting: Urology

## 2021-09-11 ENCOUNTER — Other Ambulatory Visit: Payer: Self-pay

## 2021-09-11 ENCOUNTER — Ambulatory Visit (HOSPITAL_COMMUNITY)
Admission: RE | Admit: 2021-09-11 | Discharge: 2021-09-11 | Disposition: A | Discharge status: Home / Self Care | Payer: Managed Care, Other (non HMO) | Source: Ambulatory Visit | Attending: Urology | Admitting: Urology

## 2021-09-11 DIAGNOSIS — N201 Calculus of ureter: Secondary | ICD-10-CM | POA: Diagnosis present

## 2021-09-15 ENCOUNTER — Other Ambulatory Visit: Payer: Self-pay

## 2021-09-15 ENCOUNTER — Encounter: Payer: Self-pay | Admitting: Urology

## 2021-09-15 ENCOUNTER — Ambulatory Visit: Payer: Managed Care, Other (non HMO) | Admitting: Urology

## 2021-09-15 VITALS — BP 111/71 | HR 92 | Ht 74.0 in | Wt 256.0 lb

## 2021-09-15 DIAGNOSIS — N201 Calculus of ureter: Secondary | ICD-10-CM

## 2021-09-15 NOTE — Progress Notes (Signed)
Urological Symptom Review  Patient is experiencing the following symptoms: Get up at night to urinate Weak stream   Review of Systems  Gastrointestinal (upper)  : Negative for upper GI symptoms  Gastrointestinal (lower) : Negative for lower GI symptoms  Constitutional : Negative for symptoms  Skin: Negative for skin symptoms  Eyes: Negative for eye symptoms  Ear/Nose/Throat : Negative for Ear/Nose/Throat symptoms  Hematologic/Lymphatic: Negative for Hematologic/Lymphatic symptoms  Cardiovascular : Negative for cardiovascular symptoms  Respiratory : Negative for respiratory symptoms  Endocrine: Negative for endocrine symptoms  Musculoskeletal: Negative for musculoskeletal symptoms  Neurological: Negative for neurological symptoms  Psychologic: Negative for psychiatric symptoms  

## 2021-09-15 NOTE — Progress Notes (Signed)
Assessment: 1. Ureteral calculus, left     Plan: I personally reviewed the KUB study from 09/11/2021.  There appears to be a small residual fragment measuring 3 mm in the area of the left distal ureter.  He is currently asymptomatic. Recommend continued observation. Return to office in 7-10 days with previsit KUB.  Chief Complaint:  Chief Complaint  Patient presents with   ureteral calculus    History of Present Illness:  Roger Orozco is a 61 y.o. year old male who is seen for continued evaluation of a left ureteral calculus.  He was seen in the emergency room on 08/21/2021 with left flank pain.  He noted onset of left flank pain approximately 2 days before presenting to the emergency room.  No fevers, chills, nausea, or vomiting.  No dysuria or gross hematuria.  CT imaging demonstrated a 10 mm calculus in the proximal left ureter near the UPJ with mild to moderate left hydronephrosis. He has a prior history of kidney stones with stone episodes approximately every 5 years.  He thinks he may have had uric acid stones in the past.  He previously underwent ureteroscopic stone manipulation and lithotripsy.  He is s/p left ESL on 09/01/21. KUB from 09/11/21 demonstrated a 3 mm calcification in the left pelvis.  He returns today for follow-up.  He has passed some gravel.  No flank pain.  No dysuria or gross hematuria.  Portions of the above documentation were copied from a prior visit for review purposes only.   Past Medical History:  Past Medical History:  Diagnosis Date   Aortic aneurysm (HCC)    Gout    History of kidney stones    Hyperlipidemia    Hypertension    Renal disorder    kidney stones    Thyroid disease     Past Surgical History:  Past Surgical History:  Procedure Laterality Date   ANKLE ARTHROSCOPY Left    BACK SURGERY     lower back   EXTRACORPOREAL SHOCK WAVE LITHOTRIPSY Left 09/01/2021   Procedure: EXTRACORPOREAL SHOCK WAVE LITHOTRIPSY (ESWL);  Surgeon:  Malen Gauze, MD;  Location: [facility];  Service: Urology;  Laterality: Left;   KNEE SURGERY Right    when pt was 61 years old     Allergies:  No Known Allergies  Family History:  Family History  Problem Relation Age of Onset   Hypertension Mother    Heart disease Sister        pulmonary hypertension    Social History:  Social History   Tobacco Use   Smoking status: Never   Smokeless tobacco: Never  Vaping Use   Vaping Use: Never used  Substance Use Topics   Alcohol use: Yes    Comment: occ   Drug use: Never    ROS: Constitutional:  Negative for fever, chills, weight loss CV: Negative for chest pain, previous MI, hypertension Respiratory:  Negative for shortness of breath, wheezing, sleep apnea, frequent cough GI:  Negative for nausea, vomiting, bloody stool, GERD  Physical exam: BP 111/71 (BP Location: Left Arm)    Pulse 92    Ht 6\' 2"  (1.88 m)    Wt 256 lb (116.1 kg)    BMI 32.87 kg/m  GENERAL APPEARANCE:  Well appearing, well developed, well nourished, NAD HEENT:  Atraumatic, normocephalic, oropharynx clear NECK:  Supple without lymphadenopathy or thyromegaly ABDOMEN:  Soft, non-tender, no masses EXTREMITIES:  Moves all extremities well, without clubbing, cyanosis, or edema NEUROLOGIC:  Alert  and oriented x 3, normal gait, CN II-XII grossly intact MENTAL STATUS:  appropriate BACK:  Non-tender to palpation, No CVAT SKIN:  Warm, dry, and intact  Results: No specimen provided

## 2021-09-24 ENCOUNTER — Other Ambulatory Visit: Payer: Self-pay

## 2021-09-24 ENCOUNTER — Ambulatory Visit (HOSPITAL_COMMUNITY)
Admission: RE | Admit: 2021-09-24 | Discharge: 2021-09-24 | Disposition: A | Discharge status: Home / Self Care | Payer: Managed Care, Other (non HMO) | Source: Ambulatory Visit | Attending: Urology | Admitting: Urology

## 2021-09-24 DIAGNOSIS — N201 Calculus of ureter: Secondary | ICD-10-CM | POA: Insufficient documentation

## 2021-09-25 ENCOUNTER — Encounter: Payer: Self-pay | Admitting: Urology

## 2021-09-25 ENCOUNTER — Ambulatory Visit: Payer: Managed Care, Other (non HMO) | Admitting: Urology

## 2021-09-25 VITALS — BP 139/82 | HR 66 | Wt 258.0 lb

## 2021-09-25 DIAGNOSIS — N201 Calculus of ureter: Secondary | ICD-10-CM

## 2021-09-25 LAB — URINALYSIS, ROUTINE W REFLEX MICROSCOPIC
Bilirubin, UA: NEGATIVE
Ketones, UA: NEGATIVE
Leukocytes,UA: NEGATIVE
Nitrite, UA: NEGATIVE
Specific Gravity, UA: 1.025 (ref 1.005–1.030)
Urobilinogen, Ur: 0.2 mg/dL (ref 0.2–1.0)
pH, UA: 5.5 (ref 5.0–7.5)

## 2021-09-25 LAB — MICROSCOPIC EXAMINATION
Renal Epithel, UA: NONE SEEN /hpf
WBC, UA: NONE SEEN /hpf (ref 0–5)

## 2021-09-25 NOTE — Progress Notes (Signed)

## 2021-09-25 NOTE — Progress Notes (Signed)
Assessment: 1. Ureteral calculus, left     Plan: I reviewed the KUB from 09/24/2021 which shows a possible small residual fragment in the area of the left distal ureter. Recommend continued observation. Continue tamsulosin Return to office in 1 month with KUB  Chief Complaint:  Chief Complaint  Patient presents with   Ureteral calculus    History of Present Illness:  Roger Orozco is a 61 y.o. year old male who is seen for continued evaluation of a left ureteral calculus.  He was seen in the emergency room on 08/21/2021 with left flank pain.  He noted onset of left flank pain approximately 2 days before presenting to the emergency room.  No fevers, chills, nausea, or vomiting.  No dysuria or gross hematuria.  CT imaging demonstrated a 10 mm calculus in the proximal left ureter near the UPJ with mild to moderate left hydronephrosis. He has a prior history of kidney stones with stone episodes approximately every 5 years.  He thinks he may have had uric acid stones in the past.  He previously underwent ureteroscopic stone manipulation and lithotripsy.  He is s/p left ESL on 09/01/21. KUB from 09/11/21 demonstrated a 3 mm calcification in the left pelvis. KUB from 09/24/2021 shows a persistent punctate calcification in the left lower pelvis consistent with a possible distal left ureteral calculus.  He returns today for follow-up.  He is not having any flank pain.  He is not aware of passing any additional stone fragments.  No dysuria or gross hematuria.   Portions of the above documentation were copied from a prior visit for review purposes only.   Past Medical History:  Past Medical History:  Diagnosis Date   Aortic aneurysm ([place])    Gout    History of kidney stones    Hyperlipidemia    Hypertension    Renal disorder    kidney stones    Thyroid disease     Past Surgical History:  Past Surgical History:  Procedure Laterality Date   ANKLE ARTHROSCOPY Left    BACK SURGERY      lower back   EXTRACORPOREAL SHOCK WAVE LITHOTRIPSY Left 09/01/2021   Procedure: EXTRACORPOREAL SHOCK WAVE LITHOTRIPSY (ESWL);  Surgeon: Cleon Gustin, MD;  Location: [facility];  Service: Urology;  Laterality: Left;   KNEE SURGERY Right    when pt was 61 years old     Allergies:  No Known Allergies  Family History:  Family History  Problem Relation Age of Onset   Hypertension Mother    Heart disease Sister        pulmonary hypertension    Social History:  Social History   Tobacco Use   Smoking status: Never   Smokeless tobacco: Never  Vaping Use   Vaping Use: Never used  Substance Use Topics   Alcohol use: Yes    Comment: occ   Drug use: Never    ROS: Constitutional:  Negative for fever, chills, weight loss CV: Negative for chest pain, previous MI, hypertension Respiratory:  Negative for shortness of breath, wheezing, sleep apnea, frequent cough GI:  Negative for nausea, vomiting, bloody stool, GERD  Physical exam: BP 139/82    Pulse 66    Wt 258 lb (117 kg)    BMI 33.13 kg/m  GENERAL APPEARANCE:  Well appearing, well developed, well nourished, NAD HEENT:  Atraumatic, normocephalic, oropharynx clear NECK:  Supple without lymphadenopathy or thyromegaly ABDOMEN:  Soft, non-tender, no masses EXTREMITIES:  Moves all extremities well,  without clubbing, cyanosis, or edema NEUROLOGIC:  Alert and oriented x 3, normal gait, CN II-XII grossly intact MENTAL STATUS:  appropriate BACK:  Non-tender to palpation, No CVAT SKIN:  Warm, dry, and intact  Results: U/A:   0-2 RBC, few bacteria

## 2021-10-26 ENCOUNTER — Ambulatory Visit: Payer: Managed Care, Other (non HMO) | Admitting: Urology

## 2021-11-23 ENCOUNTER — Ambulatory Visit: Payer: Managed Care, Other (non HMO) | Admitting: Gastroenterology

## 2021-12-23 ENCOUNTER — Other Ambulatory Visit: Payer: Self-pay | Admitting: Urology

## 2021-12-31 DIAGNOSIS — E039 Hypothyroidism, unspecified: Secondary | ICD-10-CM | POA: Diagnosis not present

## 2021-12-31 DIAGNOSIS — I10 Essential (primary) hypertension: Secondary | ICD-10-CM | POA: Diagnosis not present

## 2021-12-31 DIAGNOSIS — E782 Mixed hyperlipidemia: Secondary | ICD-10-CM | POA: Diagnosis not present

## 2021-12-31 DIAGNOSIS — E1165 Type 2 diabetes mellitus with hyperglycemia: Secondary | ICD-10-CM | POA: Diagnosis not present

## 2022-01-21 ENCOUNTER — Other Ambulatory Visit (HOSPITAL_COMMUNITY): Payer: Managed Care, Other (non HMO)

## 2022-01-25 ENCOUNTER — Other Ambulatory Visit: Payer: Self-pay | Admitting: Urology

## 2022-01-25 ENCOUNTER — Other Ambulatory Visit: Payer: Self-pay | Admitting: Cardiovascular Disease

## 2022-02-24 ENCOUNTER — Encounter (HOSPITAL_COMMUNITY): Payer: Self-pay

## 2022-02-24 ENCOUNTER — Ambulatory Visit (HOSPITAL_COMMUNITY): Payer: Managed Care, Other (non HMO)

## 2022-03-30 ENCOUNTER — Other Ambulatory Visit: Payer: Self-pay | Admitting: Urology

## 2022-06-09 ENCOUNTER — Other Ambulatory Visit: Payer: Self-pay | Admitting: Urology

## 2022-06-09 DIAGNOSIS — N201 Calculus of ureter: Secondary | ICD-10-CM

## 2022-07-05 ENCOUNTER — Other Ambulatory Visit: Payer: Self-pay | Admitting: Urology

## 2022-07-19 DIAGNOSIS — E782 Mixed hyperlipidemia: Secondary | ICD-10-CM | POA: Diagnosis not present

## 2022-07-19 DIAGNOSIS — I10 Essential (primary) hypertension: Secondary | ICD-10-CM | POA: Diagnosis not present

## 2022-07-19 DIAGNOSIS — E1165 Type 2 diabetes mellitus with hyperglycemia: Secondary | ICD-10-CM | POA: Diagnosis not present

## 2022-07-19 DIAGNOSIS — E039 Hypothyroidism, unspecified: Secondary | ICD-10-CM | POA: Diagnosis not present

## 2022-08-04 DIAGNOSIS — I10 Essential (primary) hypertension: Secondary | ICD-10-CM | POA: Diagnosis not present

## 2022-08-04 DIAGNOSIS — M109 Gout, unspecified: Secondary | ICD-10-CM | POA: Diagnosis not present

## 2022-08-04 DIAGNOSIS — R945 Abnormal results of liver function studies: Secondary | ICD-10-CM | POA: Diagnosis not present

## 2022-08-04 DIAGNOSIS — R002 Palpitations: Secondary | ICD-10-CM | POA: Diagnosis not present

## 2022-08-04 DIAGNOSIS — I719 Aortic aneurysm of unspecified site, without rupture: Secondary | ICD-10-CM | POA: Diagnosis not present

## 2022-08-04 DIAGNOSIS — E1165 Type 2 diabetes mellitus with hyperglycemia: Secondary | ICD-10-CM | POA: Diagnosis not present

## 2022-08-04 DIAGNOSIS — E039 Hypothyroidism, unspecified: Secondary | ICD-10-CM | POA: Diagnosis not present

## 2022-08-04 DIAGNOSIS — Z1211 Encounter for screening for malignant neoplasm of colon: Secondary | ICD-10-CM | POA: Diagnosis not present

## 2022-08-04 DIAGNOSIS — E669 Obesity, unspecified: Secondary | ICD-10-CM | POA: Diagnosis not present

## 2022-08-04 DIAGNOSIS — E782 Mixed hyperlipidemia: Secondary | ICD-10-CM | POA: Diagnosis not present

## 2022-08-04 DIAGNOSIS — M545 Low back pain, unspecified: Secondary | ICD-10-CM | POA: Diagnosis not present

## 2022-09-06 ENCOUNTER — Other Ambulatory Visit: Payer: Self-pay | Admitting: Urology

## 2022-09-08 ENCOUNTER — Other Ambulatory Visit: Payer: Self-pay

## 2022-09-08 ENCOUNTER — Telehealth: Payer: Self-pay

## 2022-09-08 DIAGNOSIS — I7121 Aneurysm of the ascending aorta, without rupture: Secondary | ICD-10-CM

## 2022-09-08 NOTE — Telephone Encounter (Signed)
Contacted patient, he is overdue for a CT ANGIO AORTA scan- last scan order was cancelled. I did reorder the scan- patient wanted to get the scan completed at a new place "radiology assist" but recommended patient get this completed with [place]. Patient advised he would monitor and see how much the cost was- advised I would monitor for scheduling.  Patient verbalized understanding.  Do you want me to wait until the scan is completed before scheduling f/u?

## 2022-09-08 NOTE — Telephone Encounter (Signed)
-----  Message from Otila Kluver sent at 09/08/2022  2:11 PM EST ----- Regarding: Questions/Appt Need? He came in and wanted a 1 yr f/u. He thinks possible CT??  Has a place he wants it done that is cheaper.  Will you call him please? Or let me know what you would like done. [facility] is provider

## 2022-10-14 ENCOUNTER — Encounter: Payer: Self-pay | Admitting: Cardiovascular Disease

## 2022-10-19 ENCOUNTER — Ambulatory Visit
Admission: RE | Admit: 2022-10-19 | Discharge: 2022-10-19 | Disposition: A | Discharge status: Home / Self Care | Payer: 59 | Source: Ambulatory Visit | Attending: Cardiovascular Disease | Admitting: Cardiovascular Disease

## 2022-10-19 DIAGNOSIS — I2510 Atherosclerotic heart disease of native coronary artery without angina pectoris: Secondary | ICD-10-CM | POA: Diagnosis not present

## 2022-10-19 DIAGNOSIS — I712 Thoracic aortic aneurysm, without rupture, unspecified: Secondary | ICD-10-CM | POA: Diagnosis not present

## 2022-10-19 DIAGNOSIS — I7121 Aneurysm of the ascending aorta, without rupture: Secondary | ICD-10-CM

## 2022-10-19 MED ORDER — IOPAMIDOL (ISOVUE-370) INJECTION 76%
75.0000 mL | Freq: Once | INTRAVENOUS | Status: AC | PRN
  Administered 2022-10-19: 75 mL via INTRAVENOUS

## 2022-11-07 NOTE — Progress Notes (Unsigned)
Cardiology Office Note:   Date:  11/11/2022  NAME:  Roger Orozco    MRN: [id number] DOB:  21-Aug-1961   PCP:  Celene Squibb, MD  Cardiologist:  None  Electrophysiologist:  None   Referring MD: Celene Squibb, MD   Chief Complaint  Patient presents with   Follow-up         History of Present Illness:   Roger Orozco is a 62 y.o. male with a hx of ascending aortic aneurysm, coronary calcifications, HLD who presents for follow-up.  He reports he is doing well.  He is exercising 5 times per week.  Walking 3 miles per session.  No chest pressure or shortness of breath.  Reports no heart racing episodes.  BP 130/79.  CT scan shows stable aortic measurements.  No significant valvular heart disease.  Most recent LDL cholesterol seems to be close to goal.  Overall doing quite well.  Problem List 1. DM -A1c 6.5 2. HTN 3. HLD -T chol 153, HDL 49, LDL 73, TG 183 4. Obesity -BMI 33 5. Thoracic aortic aneurysm  -45 mm 10/03/2020 -45 mm 10/19/2022 6. Coronary calcifications  -moderate to severe coronary calcifications   Past Medical History: Past Medical History:  Diagnosis Date   Aortic aneurysm (HCC)    Gout    History of kidney stones    Hyperlipidemia    Hypertension    Renal disorder    kidney stones    Thyroid disease     Past Surgical History: Past Surgical History:  Procedure Laterality Date   ANKLE ARTHROSCOPY Left    BACK SURGERY     lower back   EXTRACORPOREAL SHOCK WAVE LITHOTRIPSY Left 09/01/2021   Procedure: EXTRACORPOREAL SHOCK WAVE LITHOTRIPSY (ESWL);  Surgeon: Cleon Gustin, MD;  Location: [facility];  Service: Urology;  Laterality: Left;   KNEE SURGERY Right    when pt was 62 years old     Current Medications: Current Meds  Medication Sig   allopurinol (ZYLOPRIM) 300 MG tablet Take 300 mg by mouth daily.   amLODipine (NORVASC) 10 MG tablet Take 10 mg by mouth daily.   finasteride (PROSCAR) 5 MG tablet Take 5 mg by mouth daily.   levothyroxine  (SYNTHROID) 100 MCG tablet Take 100 mcg by mouth every morning.   olmesartan (BENICAR) 40 MG tablet TAKE ONE TABLET (40MG  TOTAL) BY MOUTH DAILY   rosuvastatin (CRESTOR) 40 MG tablet TAKE ONE TABLET (40MG  TOTAL) BY MOUTH DAILY   tamsulosin (FLOMAX) 0.4 MG CAPS capsule TAKE ONE CAPSULE (0.4MG  TOTAL) BY MOUTH AT BEDTIME     Allergies:    Patient has no known allergies.   Social History: Social History   Socioeconomic History   Marital status: Married    Spouse name: Not on file   Number of children: 1   Years of education: Not on file   Highest education level: Not on file  Occupational History   Occupation: Health and safety inspector  Tobacco Use   Smoking status: Never   Smokeless tobacco: Never  Vaping Use   Vaping Use: Never used  Substance and Sexual Activity   Alcohol use: Yes    Comment: occ   Drug use: Never   Sexual activity: Not on file  Other Topics Concern   Not on file  Social History Narrative   Not on file   Social Determinants of Health   Financial Resource Strain: Not on file  Food Insecurity: Not on file  Transportation Needs: Not on  file  Physical Activity: Not on file  Stress: Not on file  Social Connections: Not on file     Family History: The patient's family history includes Heart disease in his sister; Hypertension in his mother.  ROS:   All other ROS reviewed and negative. Pertinent positives noted in the HPI.     EKGs/Labs/Other Studies Reviewed:   The following studies were personally reviewed by me today:   Recent Labs: No results found for requested labs within last 365 days.   Recent Lipid Panel No results found for: "CHOL", "TRIG", "HDL", "CHOLHDL", "VLDL", "LDLCALC", "LDLDIRECT"  Physical Exam:   VS:  BP 130/79   Pulse 64   Ht 6\' 2"  (1.88 m)   Wt 258 lb (117 kg)   SpO2 98%   BMI 33.13 kg/m    Wt Readings from Last 3 Encounters:  11/11/22 258 lb (117 kg)  09/25/21 258 lb (117 kg)  09/15/21 256 lb (116.1 kg)    General: Well  nourished, well developed, in no acute distress Head: Atraumatic, normal size  Eyes: PEERLA, EOMI  Neck: Supple, no JVD Endocrine: No thryomegaly Cardiac: Normal S1, S2; RRR; no murmurs, rubs, or gallops Lungs: Clear to auscultation bilaterally, no wheezing, rhonchi or rales  Abd: Soft, nontender, no hepatomegaly  Ext: No edema, pulses 2+ Musculoskeletal: No deformities, BUE and BLE strength normal and equal Skin: Warm and dry, no rashes   Neuro: Alert and oriented to person, place, time, and situation, CNII-XII grossly intact, no focal deficits  Psych: Normal mood and affect   ASSESSMENT:   Roger Orozco is a 62 y.o. male who presents for the following: 1. Aneurysm of ascending aorta without rupture ([place])   2. Mixed hyperlipidemia   3. Coronary artery calcification seen on computed tomography     PLAN:   1. Aneurysm of ascending aorta without rupture (HCC) -Aorta stable at 45 mm for the past 2 years.  Plan to repeat an echocardiogram in 1 year.  Seems to have stable measurements that are comparable.  I have recommended good blood pressure control.  BP less than 130/80.  He is on amlodipine 10 mg daily and Benicar 40 mg daily.  Will continue this.  No concerning symptoms of angina.  No significant valvular heart disease.  Will continue with surveillance.  2. Mixed hyperlipidemia 3. Coronary artery calcification seen on computed tomography -On Crestor 40 mg daily.  LDL 73.  Close enough to goal.  No symptoms of angina.  Disposition: Return in about 1 year (around 11/11/2023).  Medication Adjustments/Labs and Tests Ordered: Current medicines are reviewed at length with the patient today.  Concerns regarding medicines are outlined above.  Orders Placed This Encounter  Procedures   ECHOCARDIOGRAM COMPLETE   No orders of the defined types were placed in this encounter.   Patient Instructions  Medication Instructions:  The current medical regimen is effective;  continue present  plan and medications.  *If you need a refill on your cardiac medications before your next appointment, please call your pharmacy*   Testing/Procedures: Echocardiogram (1 year before follow up) - Your physician has requested that you have an echocardiogram. Echocardiography is a painless test that uses sound waves to create images of your heart. It provides your doctor with information about the size and shape of your heart and how well your heart's chambers and valves are working. This procedure takes approximately one hour. There are no restrictions for this procedure.     Follow-Up: At [facility], you and your health needs are our priority.  As part of our continuing mission to provide you with exceptional heart care, we have created designated Provider Care Teams.  These Care Teams include your primary Cardiologist (physician) and Advanced Practice Providers (APPs -  Physician Assistants and Nurse Practitioners) who all work together to provide you with the care you need, when you need it.  We recommend signing up for the patient portal called "MyChart".  Sign up information is provided on this After Visit Summary.  MyChart is used to connect with patients for Virtual Visits (Telemedicine).  Patients are able to view lab/test results, encounter notes, upcoming appointments, etc.  Non-urgent messages can be sent to your provider as well.   To learn more about what you can do with MyChart, go to [email or web address].    Your next appointment:   12 month(s)  Provider:   Eleonore Chiquito, MD       Time Spent with Patient: I have spent a total of 25 minutes with patient reviewing hospital notes, telemetry, EKGs, labs and examining the patient as well as establishing an assessment and plan that was discussed with the patient.  > 50% of time was spent in direct patient care.  Signed, Addison Naegeli. Audie Box, MD, [facility]  [address], [place], [place] [address] [phone]  11/11/2022 11:22 AM

## 2022-11-11 ENCOUNTER — Encounter: Payer: Self-pay | Admitting: Cardiovascular Disease

## 2022-11-11 ENCOUNTER — Ambulatory Visit: Payer: 59 | Attending: Cardiovascular Disease | Admitting: Cardiovascular Disease

## 2022-11-11 VITALS — BP 130/79 | HR 64 | Ht 74.0 in | Wt 258.0 lb

## 2022-11-11 DIAGNOSIS — I2510 Atherosclerotic heart disease of native coronary artery without angina pectoris: Secondary | ICD-10-CM | POA: Diagnosis not present

## 2022-11-11 DIAGNOSIS — I7121 Aneurysm of the ascending aorta, without rupture: Secondary | ICD-10-CM

## 2022-11-11 DIAGNOSIS — E782 Mixed hyperlipidemia: Secondary | ICD-10-CM

## 2022-11-11 NOTE — Patient Instructions (Signed)
Medication Instructions:  The current medical regimen is effective;  continue present plan and medications.  *If you need a refill on your cardiac medications before your next appointment, please call your pharmacy*   Testing/Procedures: Echocardiogram (1 year before follow up) - Your physician has requested that you have an echocardiogram. Echocardiography is a painless test that uses sound waves to create images of your heart. It provides your doctor with information about the size and shape of your heart and how well your heart's chambers and valves are working. This procedure takes approximately one hour. There are no restrictions for this procedure.     Follow-Up: At [facility], you and your health needs are our priority.  As part of our continuing mission to provide you with exceptional heart care, we have created designated Provider Care Teams.  These Care Teams include your primary Cardiologist (physician) and Advanced Practice Providers (APPs -  Physician Assistants and Nurse Practitioners) who all work together to provide you with the care you need, when you need it.  We recommend signing up for the patient portal called "MyChart".  Sign up information is provided on this After Visit Summary.  MyChart is used to connect with patients for Virtual Visits (Telemedicine).  Patients are able to view lab/test results, encounter notes, upcoming appointments, etc.  Non-urgent messages can be sent to your provider as well.   To learn more about what you can do with MyChart, go to [email or web address].    Your next appointment:   12 month(s)  Provider:   Eleonore Chiquito, MD

## 2022-12-27 IMAGING — DX DG ABDOMEN 1V
2 series · 2 of 2 positions shown · non-contrast
Comparison: Previous studies including radiograph done on
08/27/2021

CLINICAL DATA: Renal stone

EXAM:
ABDOMEN - 1 VIEW

[abdomen kub (1 of 2)]
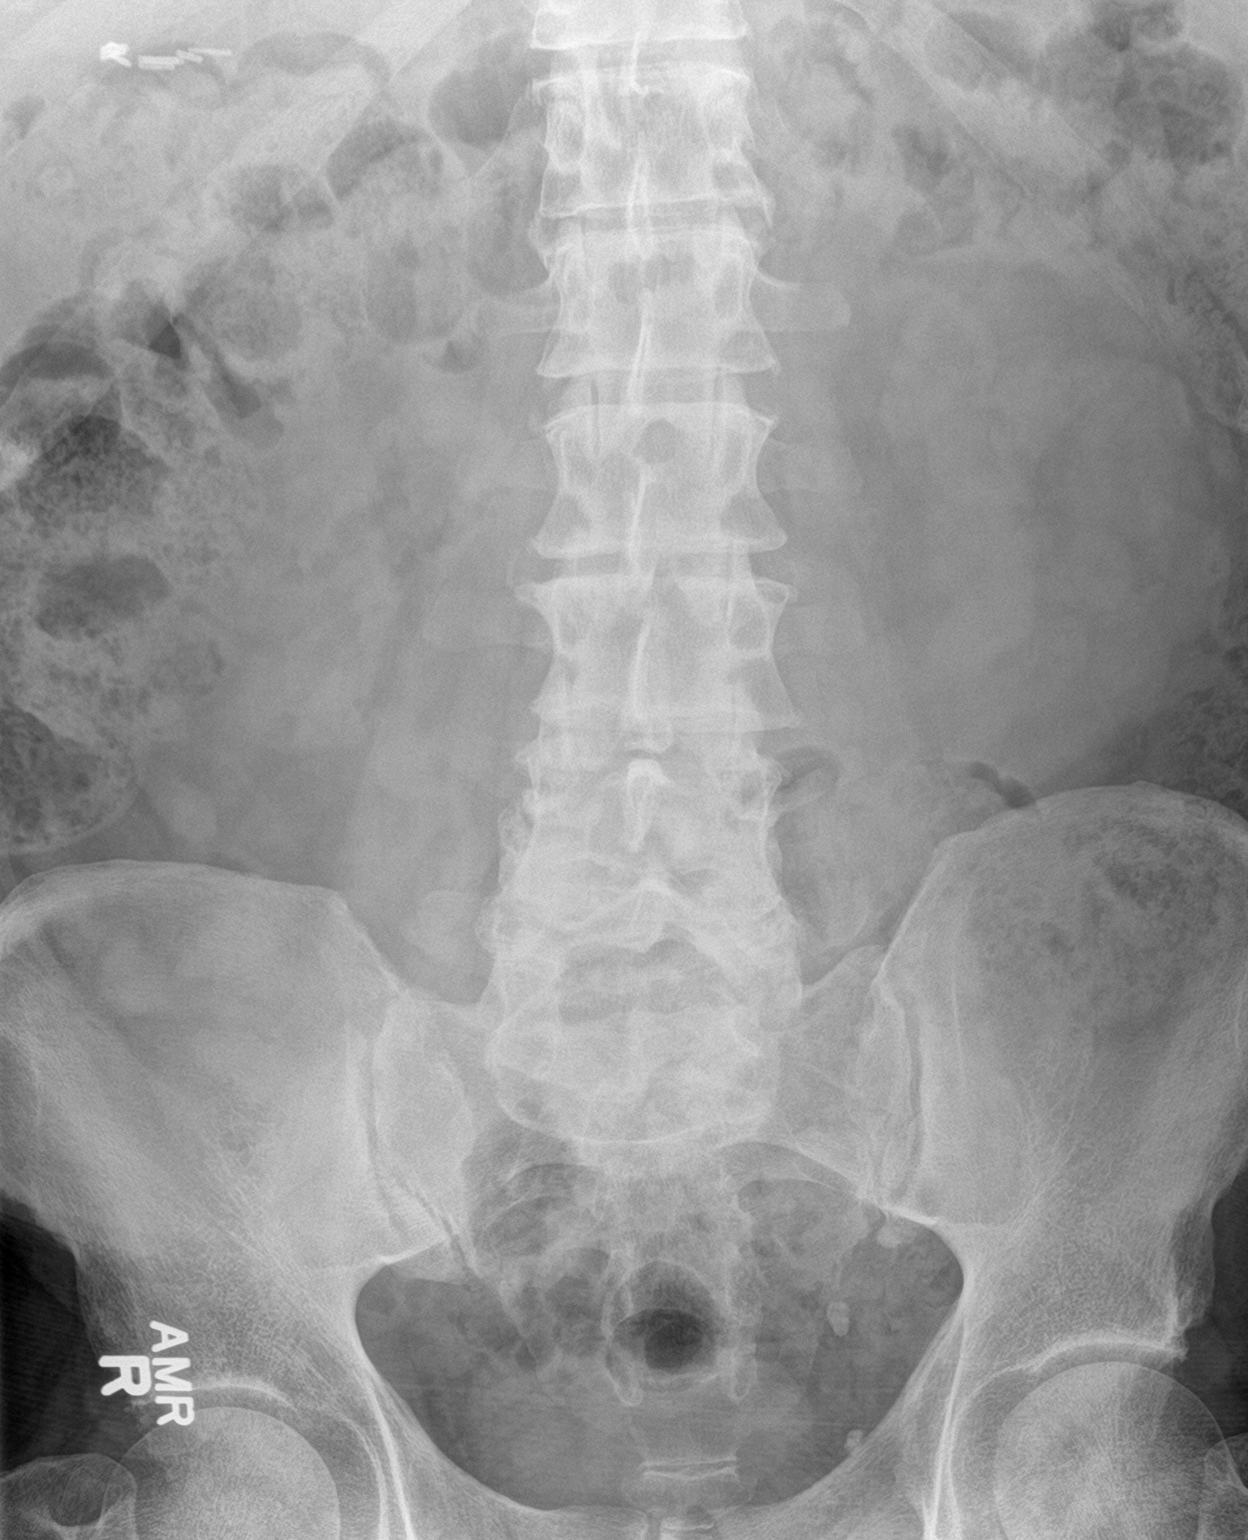

[abdomen kub (2 of 2)]
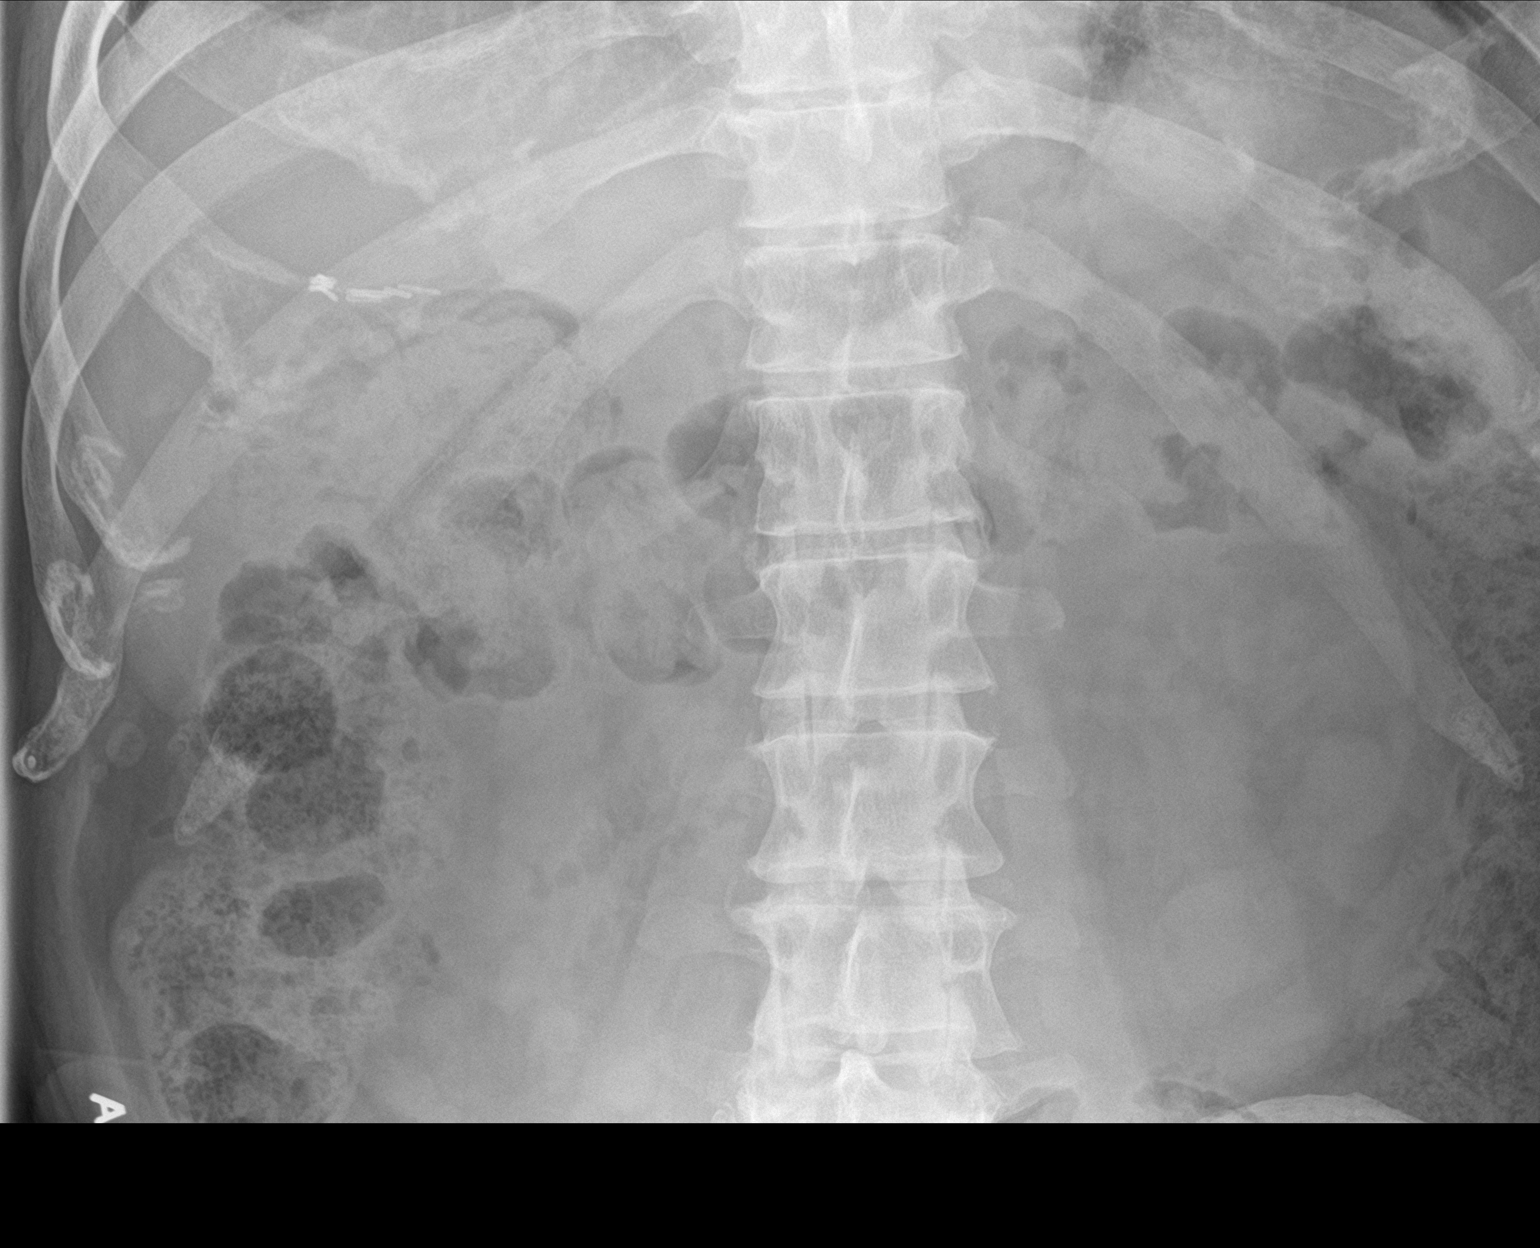

[2 of 2 positions shown; findings below may reference images not displayed]

FINDINGS: Bowel gas pattern is nonspecific. Moderate amount of stool is seen
in colon. There is 9 mm calcific density in the left side of pelvis
which was not evident in the previous examination. In the previous
study, similar calcific density was noted overlying the upper margin
of left side of sacrum. No other definite calcific densities seen.
Kidneys are partly obscured by bowel contents. Degenerative changes
are noted in the lumbar spine.
IMPRESSION: There is 9 mm calcific density in the left side of pelvis, possibly
caudal migration of left ureteral stone.

## 2023-01-06 IMAGING — DX DG ABDOMEN 1V
2 series · 2 of 2 positions shown · non-contrast
Comparison: September 01, 2021

CLINICAL DATA: Post ESWL evaluation. Status post lithotripsy 1 week
ago.

EXAM:
ABDOMEN - 1 VIEW

[abdomen kub (1 of 2)]
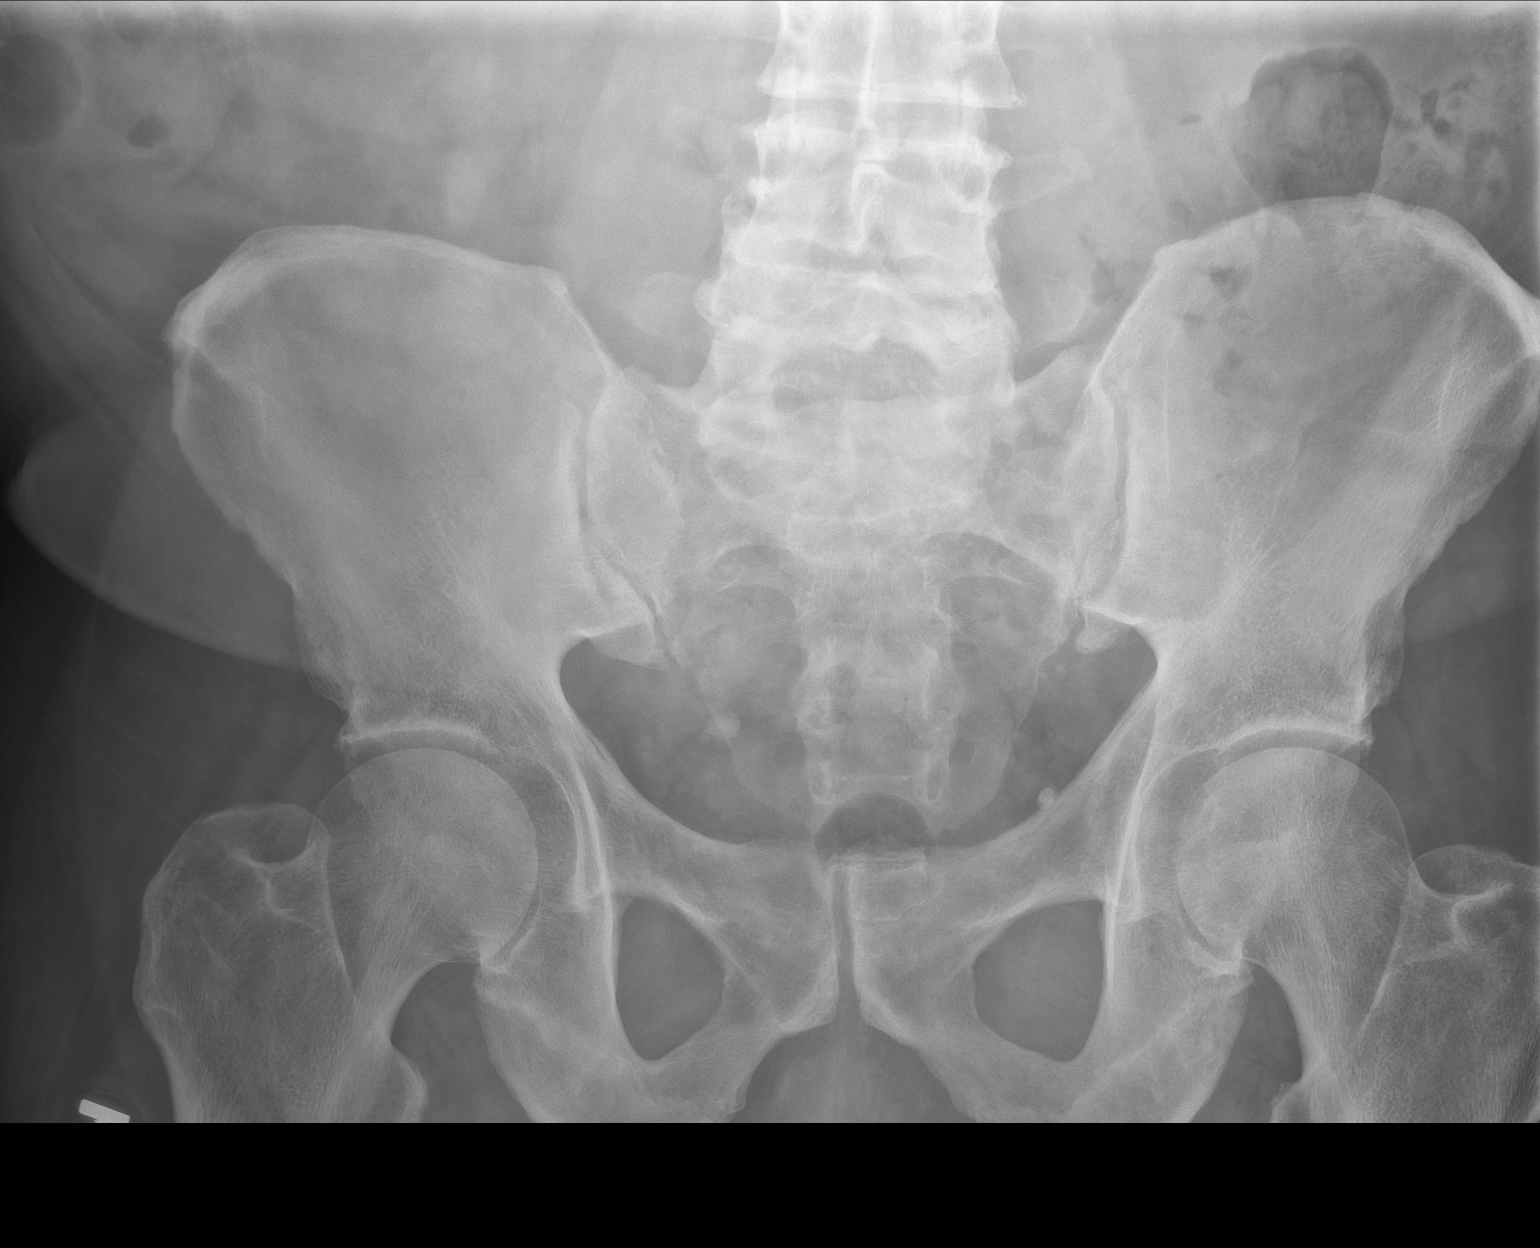

[abdomen kub (2 of 2)]
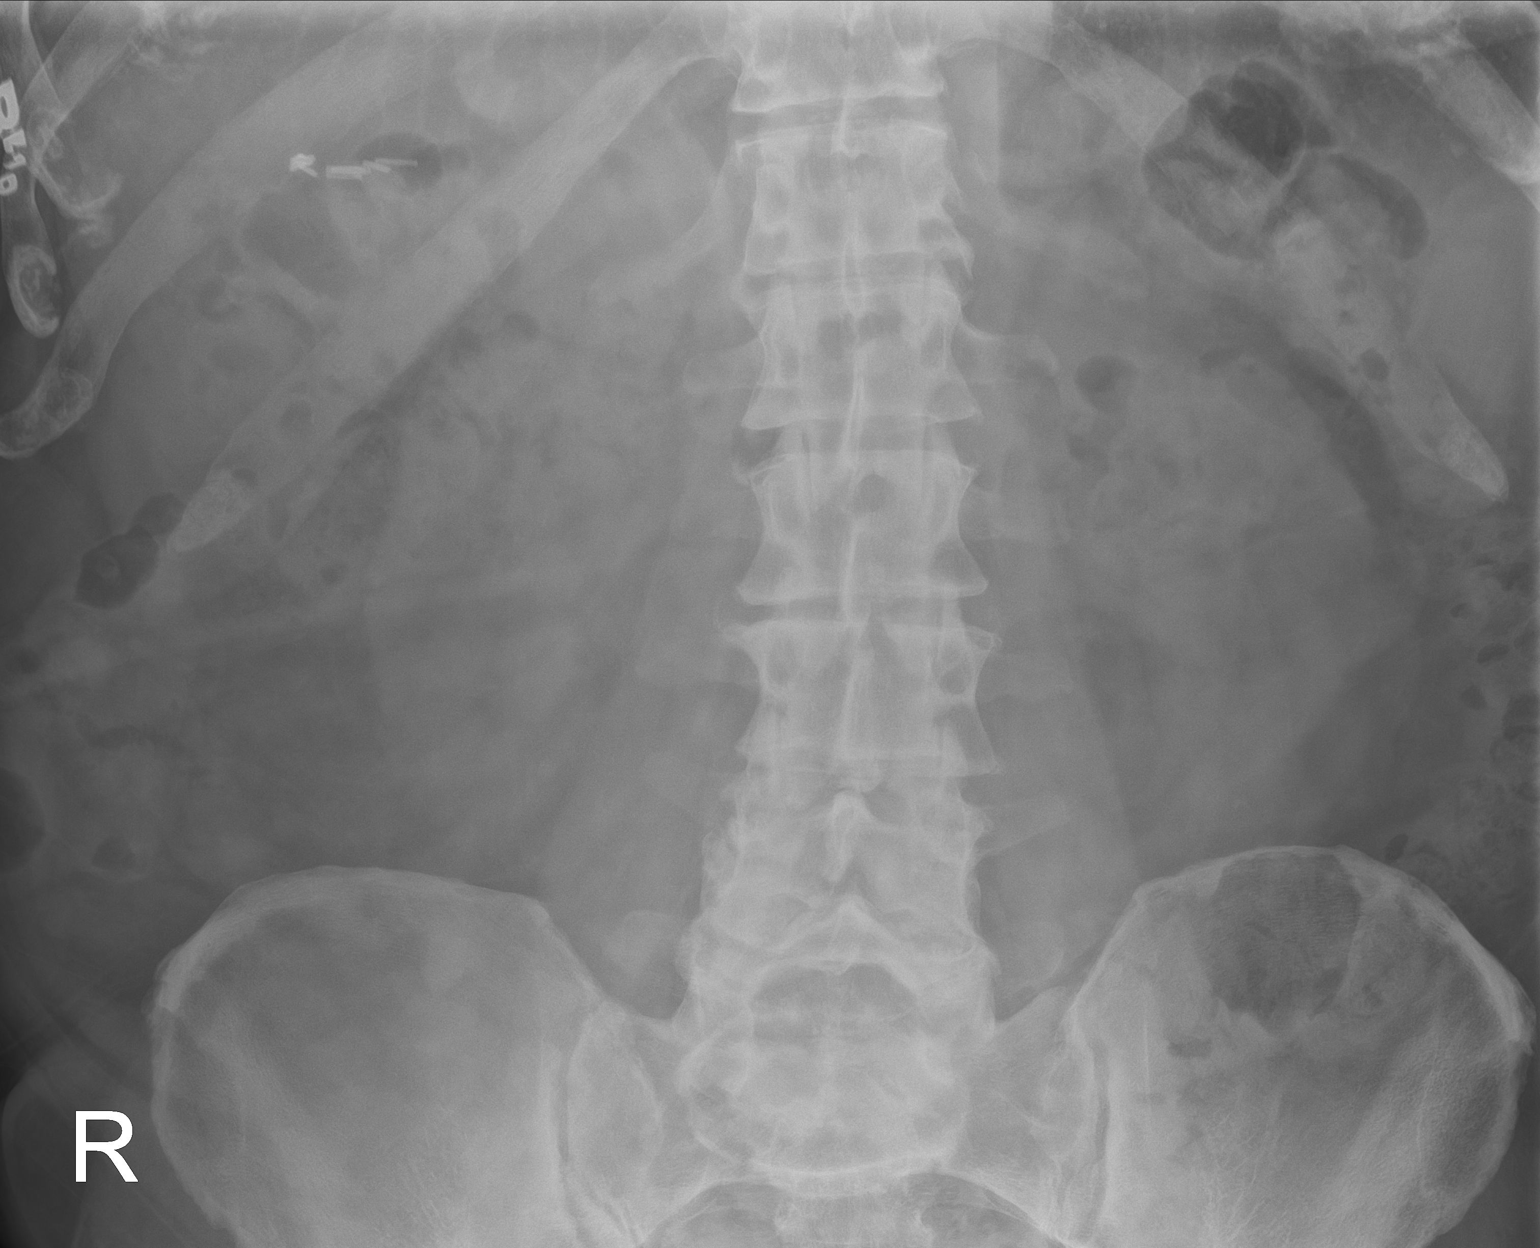

[2 of 2 positions shown; findings below may reference images not displayed]

FINDINGS: The bowel gas pattern is normal. A 3 mm soft tissue calcification is
seen within the pelvis on the left. This is within the region of the
9 mm soft tissue calcification seen on the prior study.
IMPRESSION: Suspected 3 mm distal left ureteral renal calculus.

## 2023-02-07 ENCOUNTER — Other Ambulatory Visit: Payer: Self-pay | Admitting: Cardiovascular Disease

## 2023-02-07 DIAGNOSIS — I10 Essential (primary) hypertension: Secondary | ICD-10-CM | POA: Diagnosis not present

## 2023-02-07 DIAGNOSIS — E782 Mixed hyperlipidemia: Secondary | ICD-10-CM | POA: Diagnosis not present

## 2023-02-07 DIAGNOSIS — E1165 Type 2 diabetes mellitus with hyperglycemia: Secondary | ICD-10-CM | POA: Diagnosis not present

## 2023-02-07 DIAGNOSIS — E039 Hypothyroidism, unspecified: Secondary | ICD-10-CM | POA: Diagnosis not present

## 2023-03-02 DIAGNOSIS — G8929 Other chronic pain: Secondary | ICD-10-CM | POA: Diagnosis not present

## 2023-03-02 DIAGNOSIS — I719 Aortic aneurysm of unspecified site, without rupture: Secondary | ICD-10-CM | POA: Diagnosis not present

## 2023-03-02 DIAGNOSIS — R945 Abnormal results of liver function studies: Secondary | ICD-10-CM | POA: Diagnosis not present

## 2023-03-02 DIAGNOSIS — I10 Essential (primary) hypertension: Secondary | ICD-10-CM | POA: Diagnosis not present

## 2023-03-02 DIAGNOSIS — Z0001 Encounter for general adult medical examination with abnormal findings: Secondary | ICD-10-CM | POA: Diagnosis not present

## 2023-03-02 DIAGNOSIS — B351 Tinea unguium: Secondary | ICD-10-CM | POA: Diagnosis not present

## 2023-03-02 DIAGNOSIS — E782 Mixed hyperlipidemia: Secondary | ICD-10-CM | POA: Diagnosis not present

## 2023-03-02 DIAGNOSIS — E1165 Type 2 diabetes mellitus with hyperglycemia: Secondary | ICD-10-CM | POA: Diagnosis not present

## 2023-03-02 DIAGNOSIS — R011 Cardiac murmur, unspecified: Secondary | ICD-10-CM | POA: Diagnosis not present

## 2023-03-02 DIAGNOSIS — M545 Low back pain, unspecified: Secondary | ICD-10-CM | POA: Diagnosis not present

## 2023-03-02 DIAGNOSIS — R002 Palpitations: Secondary | ICD-10-CM | POA: Diagnosis not present

## 2023-03-02 DIAGNOSIS — E669 Obesity, unspecified: Secondary | ICD-10-CM | POA: Diagnosis not present

## 2023-03-14 DIAGNOSIS — B351 Tinea unguium: Secondary | ICD-10-CM | POA: Diagnosis not present

## 2023-03-14 DIAGNOSIS — E1165 Type 2 diabetes mellitus with hyperglycemia: Secondary | ICD-10-CM | POA: Diagnosis not present

## 2023-04-13 DIAGNOSIS — B351 Tinea unguium: Secondary | ICD-10-CM | POA: Diagnosis not present

## 2023-05-09 DIAGNOSIS — B351 Tinea unguium: Secondary | ICD-10-CM | POA: Diagnosis not present

## 2023-06-13 DIAGNOSIS — B351 Tinea unguium: Secondary | ICD-10-CM | POA: Diagnosis not present

## 2023-08-25 DIAGNOSIS — E039 Hypothyroidism, unspecified: Secondary | ICD-10-CM | POA: Diagnosis not present

## 2023-08-25 DIAGNOSIS — E782 Mixed hyperlipidemia: Secondary | ICD-10-CM | POA: Diagnosis not present

## 2023-08-25 DIAGNOSIS — E119 Type 2 diabetes mellitus without complications: Secondary | ICD-10-CM | POA: Diagnosis not present

## 2023-08-25 DIAGNOSIS — I10 Essential (primary) hypertension: Secondary | ICD-10-CM | POA: Diagnosis not present

## 2023-09-08 DIAGNOSIS — Z23 Encounter for immunization: Secondary | ICD-10-CM | POA: Diagnosis not present

## 2023-09-08 DIAGNOSIS — I719 Aortic aneurysm of unspecified site, without rupture: Secondary | ICD-10-CM | POA: Diagnosis not present

## 2023-09-08 DIAGNOSIS — E039 Hypothyroidism, unspecified: Secondary | ICD-10-CM | POA: Diagnosis not present

## 2023-09-08 DIAGNOSIS — E1165 Type 2 diabetes mellitus with hyperglycemia: Secondary | ICD-10-CM | POA: Diagnosis not present

## 2023-09-08 DIAGNOSIS — R011 Cardiac murmur, unspecified: Secondary | ICD-10-CM | POA: Diagnosis not present

## 2023-09-08 DIAGNOSIS — Z87442 Personal history of urinary calculi: Secondary | ICD-10-CM | POA: Diagnosis not present

## 2023-09-08 DIAGNOSIS — R945 Abnormal results of liver function studies: Secondary | ICD-10-CM | POA: Diagnosis not present

## 2023-09-08 DIAGNOSIS — M545 Low back pain, unspecified: Secondary | ICD-10-CM | POA: Diagnosis not present

## 2023-09-08 DIAGNOSIS — I10 Essential (primary) hypertension: Secondary | ICD-10-CM | POA: Diagnosis not present

## 2023-09-08 DIAGNOSIS — E782 Mixed hyperlipidemia: Secondary | ICD-10-CM | POA: Diagnosis not present

## 2023-09-08 DIAGNOSIS — R002 Palpitations: Secondary | ICD-10-CM | POA: Diagnosis not present

## 2023-09-08 DIAGNOSIS — E669 Obesity, unspecified: Secondary | ICD-10-CM | POA: Diagnosis not present

## 2023-09-13 ENCOUNTER — Encounter (HOSPITAL_COMMUNITY): Payer: Self-pay | Admitting: Cardiovascular Disease

## 2023-09-27 ENCOUNTER — Telehealth (HOSPITAL_COMMUNITY): Payer: Self-pay | Admitting: Cardiovascular Disease

## 2023-09-27 NOTE — Telephone Encounter (Signed)
 Just an FYI. We have made several attempts to contact this patient including sending a letter to schedule or reschedule their echocardiogram. We will be removing the patient from the echo WQ.   MAILED LETTER [facility]  09/13/23 [facility] to schedule x 3 @ 8:10/LBW  09/06/23 [facility] to schedule x 2 @ 8:58/LBW  08/30/23 [facility] to schedule @ 8:27/LBW       Thank you

## 2023-12-13 ENCOUNTER — Other Ambulatory Visit: Payer: Self-pay | Admitting: Cardiovascular Disease

## 2023-12-23 DIAGNOSIS — E1165 Type 2 diabetes mellitus with hyperglycemia: Secondary | ICD-10-CM | POA: Diagnosis not present

## 2023-12-23 DIAGNOSIS — E039 Hypothyroidism, unspecified: Secondary | ICD-10-CM | POA: Diagnosis not present

## 2023-12-23 DIAGNOSIS — I10 Essential (primary) hypertension: Secondary | ICD-10-CM | POA: Diagnosis not present

## 2023-12-29 DIAGNOSIS — R945 Abnormal results of liver function studies: Secondary | ICD-10-CM | POA: Diagnosis not present

## 2023-12-29 DIAGNOSIS — E669 Obesity, unspecified: Secondary | ICD-10-CM | POA: Diagnosis not present

## 2023-12-29 DIAGNOSIS — E1140 Type 2 diabetes mellitus with diabetic neuropathy, unspecified: Secondary | ICD-10-CM | POA: Diagnosis not present

## 2023-12-29 DIAGNOSIS — E1165 Type 2 diabetes mellitus with hyperglycemia: Secondary | ICD-10-CM | POA: Diagnosis not present

## 2023-12-29 DIAGNOSIS — E039 Hypothyroidism, unspecified: Secondary | ICD-10-CM | POA: Diagnosis not present

## 2023-12-29 DIAGNOSIS — I719 Aortic aneurysm of unspecified site, without rupture: Secondary | ICD-10-CM | POA: Diagnosis not present

## 2023-12-29 DIAGNOSIS — R011 Cardiac murmur, unspecified: Secondary | ICD-10-CM | POA: Diagnosis not present

## 2023-12-29 DIAGNOSIS — E782 Mixed hyperlipidemia: Secondary | ICD-10-CM | POA: Diagnosis not present

## 2023-12-29 DIAGNOSIS — Z87442 Personal history of urinary calculi: Secondary | ICD-10-CM | POA: Diagnosis not present

## 2023-12-29 DIAGNOSIS — R002 Palpitations: Secondary | ICD-10-CM | POA: Diagnosis not present

## 2023-12-29 DIAGNOSIS — I10 Essential (primary) hypertension: Secondary | ICD-10-CM | POA: Diagnosis not present

## 2023-12-29 DIAGNOSIS — M545 Low back pain, unspecified: Secondary | ICD-10-CM | POA: Diagnosis not present

## 2024-04-02 DIAGNOSIS — I10 Essential (primary) hypertension: Secondary | ICD-10-CM | POA: Diagnosis not present

## 2024-04-02 DIAGNOSIS — E039 Hypothyroidism, unspecified: Secondary | ICD-10-CM | POA: Diagnosis not present

## 2024-04-02 DIAGNOSIS — E1165 Type 2 diabetes mellitus with hyperglycemia: Secondary | ICD-10-CM | POA: Diagnosis not present

## 2024-04-11 DIAGNOSIS — R011 Cardiac murmur, unspecified: Secondary | ICD-10-CM | POA: Diagnosis not present

## 2024-04-11 DIAGNOSIS — I10 Essential (primary) hypertension: Secondary | ICD-10-CM | POA: Diagnosis not present

## 2024-04-11 DIAGNOSIS — I712 Thoracic aortic aneurysm, without rupture, unspecified: Secondary | ICD-10-CM | POA: Diagnosis not present

## 2024-04-11 DIAGNOSIS — E782 Mixed hyperlipidemia: Secondary | ICD-10-CM | POA: Diagnosis not present

## 2024-04-11 DIAGNOSIS — E1165 Type 2 diabetes mellitus with hyperglycemia: Secondary | ICD-10-CM | POA: Diagnosis not present

## 2024-04-11 DIAGNOSIS — E1140 Type 2 diabetes mellitus with diabetic neuropathy, unspecified: Secondary | ICD-10-CM | POA: Diagnosis not present

## 2024-04-11 DIAGNOSIS — R945 Abnormal results of liver function studies: Secondary | ICD-10-CM | POA: Diagnosis not present

## 2024-04-11 DIAGNOSIS — M62830 Muscle spasm of back: Secondary | ICD-10-CM | POA: Diagnosis not present

## 2024-04-11 DIAGNOSIS — E669 Obesity, unspecified: Secondary | ICD-10-CM | POA: Diagnosis not present

## 2024-04-11 DIAGNOSIS — R002 Palpitations: Secondary | ICD-10-CM | POA: Diagnosis not present

## 2024-04-11 DIAGNOSIS — M545 Low back pain, unspecified: Secondary | ICD-10-CM | POA: Diagnosis not present

## 2024-04-11 DIAGNOSIS — E039 Hypothyroidism, unspecified: Secondary | ICD-10-CM | POA: Diagnosis not present

## 2024-04-11 DIAGNOSIS — M9905 Segmental and somatic dysfunction of pelvic region: Secondary | ICD-10-CM | POA: Diagnosis not present

## 2024-04-11 DIAGNOSIS — M9903 Segmental and somatic dysfunction of lumbar region: Secondary | ICD-10-CM | POA: Diagnosis not present

## 2024-04-11 DIAGNOSIS — M9902 Segmental and somatic dysfunction of thoracic region: Secondary | ICD-10-CM | POA: Diagnosis not present

## 2024-04-11 DIAGNOSIS — M109 Gout, unspecified: Secondary | ICD-10-CM | POA: Diagnosis not present

## 2024-04-12 ENCOUNTER — Encounter (INDEPENDENT_AMBULATORY_CARE_PROVIDER_SITE_OTHER): Payer: Self-pay | Admitting: *Deleted

## 2024-04-13 DIAGNOSIS — M62830 Muscle spasm of back: Secondary | ICD-10-CM | POA: Diagnosis not present

## 2024-04-13 DIAGNOSIS — M9905 Segmental and somatic dysfunction of pelvic region: Secondary | ICD-10-CM | POA: Diagnosis not present

## 2024-04-13 DIAGNOSIS — M9903 Segmental and somatic dysfunction of lumbar region: Secondary | ICD-10-CM | POA: Diagnosis not present

## 2024-04-13 DIAGNOSIS — M9902 Segmental and somatic dysfunction of thoracic region: Secondary | ICD-10-CM | POA: Diagnosis not present

## 2024-04-16 DIAGNOSIS — M9902 Segmental and somatic dysfunction of thoracic region: Secondary | ICD-10-CM | POA: Diagnosis not present

## 2024-04-16 DIAGNOSIS — M9905 Segmental and somatic dysfunction of pelvic region: Secondary | ICD-10-CM | POA: Diagnosis not present

## 2024-04-16 DIAGNOSIS — M62830 Muscle spasm of back: Secondary | ICD-10-CM | POA: Diagnosis not present

## 2024-04-16 DIAGNOSIS — M9903 Segmental and somatic dysfunction of lumbar region: Secondary | ICD-10-CM | POA: Diagnosis not present

## 2024-04-18 DIAGNOSIS — M9903 Segmental and somatic dysfunction of lumbar region: Secondary | ICD-10-CM | POA: Diagnosis not present

## 2024-04-18 DIAGNOSIS — M9902 Segmental and somatic dysfunction of thoracic region: Secondary | ICD-10-CM | POA: Diagnosis not present

## 2024-04-18 DIAGNOSIS — M9905 Segmental and somatic dysfunction of pelvic region: Secondary | ICD-10-CM | POA: Diagnosis not present

## 2024-04-18 DIAGNOSIS — M62830 Muscle spasm of back: Secondary | ICD-10-CM | POA: Diagnosis not present

## 2024-04-20 DIAGNOSIS — M9902 Segmental and somatic dysfunction of thoracic region: Secondary | ICD-10-CM | POA: Diagnosis not present

## 2024-04-20 DIAGNOSIS — M9905 Segmental and somatic dysfunction of pelvic region: Secondary | ICD-10-CM | POA: Diagnosis not present

## 2024-04-20 DIAGNOSIS — M62830 Muscle spasm of back: Secondary | ICD-10-CM | POA: Diagnosis not present

## 2024-04-20 DIAGNOSIS — M9903 Segmental and somatic dysfunction of lumbar region: Secondary | ICD-10-CM | POA: Diagnosis not present

## 2024-06-01 DIAGNOSIS — M62830 Muscle spasm of back: Secondary | ICD-10-CM | POA: Diagnosis not present

## 2024-06-01 DIAGNOSIS — M9905 Segmental and somatic dysfunction of pelvic region: Secondary | ICD-10-CM | POA: Diagnosis not present

## 2024-06-01 DIAGNOSIS — M9902 Segmental and somatic dysfunction of thoracic region: Secondary | ICD-10-CM | POA: Diagnosis not present

## 2024-06-01 DIAGNOSIS — M9903 Segmental and somatic dysfunction of lumbar region: Secondary | ICD-10-CM | POA: Diagnosis not present

## 2024-06-04 DIAGNOSIS — M9903 Segmental and somatic dysfunction of lumbar region: Secondary | ICD-10-CM | POA: Diagnosis not present

## 2024-06-04 DIAGNOSIS — M62830 Muscle spasm of back: Secondary | ICD-10-CM | POA: Diagnosis not present

## 2024-06-04 DIAGNOSIS — M9905 Segmental and somatic dysfunction of pelvic region: Secondary | ICD-10-CM | POA: Diagnosis not present

## 2024-06-04 DIAGNOSIS — M9902 Segmental and somatic dysfunction of thoracic region: Secondary | ICD-10-CM | POA: Diagnosis not present

## 2024-06-06 DIAGNOSIS — M9903 Segmental and somatic dysfunction of lumbar region: Secondary | ICD-10-CM | POA: Diagnosis not present

## 2024-06-06 DIAGNOSIS — M62830 Muscle spasm of back: Secondary | ICD-10-CM | POA: Diagnosis not present

## 2024-06-06 DIAGNOSIS — M9905 Segmental and somatic dysfunction of pelvic region: Secondary | ICD-10-CM | POA: Diagnosis not present

## 2024-06-06 DIAGNOSIS — M9902 Segmental and somatic dysfunction of thoracic region: Secondary | ICD-10-CM | POA: Diagnosis not present

## 2024-06-08 DIAGNOSIS — M62830 Muscle spasm of back: Secondary | ICD-10-CM | POA: Diagnosis not present

## 2024-06-08 DIAGNOSIS — M9902 Segmental and somatic dysfunction of thoracic region: Secondary | ICD-10-CM | POA: Diagnosis not present

## 2024-06-08 DIAGNOSIS — M9905 Segmental and somatic dysfunction of pelvic region: Secondary | ICD-10-CM | POA: Diagnosis not present

## 2024-06-08 DIAGNOSIS — M9903 Segmental and somatic dysfunction of lumbar region: Secondary | ICD-10-CM | POA: Diagnosis not present

## 2024-06-12 NOTE — Progress Notes (Unsigned)
 Cardiology Office Note:  .   Date:  06/14/2024  ID:  Roger Orozco, DOB [birth year], MRN [id number] PCP: Joeann Browning, FNP  [facility] Providers Cardiologist:  None {  History of Present Illness: .    Chief Complaint  Patient presents with   Follow-up    Roger Orozco is a 63 y.o. male with history of coronary calcifications, thoracic aortic aneurysm, HLD, HTN, DM who presents for follow-up.    History of Present Illness   Roger Orozco is a 63 year old male with diabetes, hypertension, coronary calcifications, hyperlipidemia, and thoracic aortic aneurysm who presents for follow-up.  He has a thoracic aortic aneurysm that has remained stable at 45 mm since 2022. He experiences occasional chest twinges, occurring once every month or two, but has no regular chest pain or symptoms of angina.  He was diagnosed with diabetes and was prescribed Ozempic, which he took for about three weeks but discontinued due to side effects such as feeling sick. His last checkup showed his blood sugar levels were around 6.4, and he has not been on any medication since stopping Ozempic.  He has hypertension and is currently taking amlodipine  10 mg daily and olmesartan  40 mg daily.  He has hyperlipidemia and is taking Crestor  40 mg daily. His most recent LDL was 59.  He had been walking three miles a day, six days a week for about a year until he hurt his back a month ago. He is slowly getting back into walking but his back is still bothersome.  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  No significant chest pain, regular chest pain, or trouble breathing. Occasional chest twinges.          Problem List 1. DM -A1c 6.4 2. HTN 3. HLD -T chol 130, HDL 47, LDL 59, TG 139 4. Obesity -BMI 33 5. Thoracic aortic aneurysm  -45 mm 10/03/2020 -45 mm 10/19/2022 6. Coronary calcifications   -moderate to severe coronary calcifications     ROS: All other ROS reviewed and negative. Pertinent positives noted in the HPI.     Studies Reviewed: SABRA   EKG Interpretation Date/Time:  Thursday June 14 2024 09:37:11 EDT Ventricular Rate:  62 PR Interval:  178 QRS Duration:  90 QT Interval:  392 QTC Calculation: 397 R Axis:   76  Text Interpretation: Normal sinus rhythm Normal ECG Confirmed by Roger Orozco [phone]) on 06/14/2024 9:42:05 AM    CTA 10/19/2022 IMPRESSION: Grossly stable 4.5 cm ascending thoracic aortic aneurysm. Recommend semi-annual imaging followup by CTA or MRA and referral to cardiothoracic surgery if not already obtained. This recommendation follows 2010 ACCF/AHA/AATS/ACR/ASA/SCA/SCAI/SIR/STS/SVM Guidelines for the Diagnosis and Management of Patients With Thoracic Aortic Disease. Circulation. 2010; 121: Z733-z630. Aortic aneurysm NOS (ICD10-I71.9). Physical Exam:   VS:  BP 118/74   Pulse 62   Ht 6' 2 (1.88 m)   Wt 235 lb 6.4 oz (106.8 kg)   SpO2 96%   BMI 30.22 kg/m    Wt Readings from Last 3 Encounters:  06/14/24 235 lb 6.4 oz (106.8 kg)  11/11/22 258 lb (117 kg)  09/25/21 258 lb (117 kg)    GEN: Well nourished, well developed in no acute distress NECK: No JVD; No carotid bruits CARDIAC: RRR, no murmurs, rubs, gallops RESPIRATORY:  Clear to auscultation without rales, wheezing or rhonchi  ABDOMEN: Soft, non-tender,  non-distended EXTREMITIES:  No edema; No deformity  ASSESSMENT AND PLAN: .   Assessment and Plan    Thoracic aortic aneurysm Thoracic aortic aneurysm stable at 45 mm since 2022, asymptomatic. - Order gated aorta CT scan. - Perform blood panel for kidney profile prior to CT scan. - Advise to report any new chest pains or dyspnea.  Atherosclerotic heart disease of native coronary artery without angina No angina symptoms, normal EKG, coronary calcifications present. - Continue current management and  monitoring.  Hypertension Blood pressure controlled at 118/74 mmHg with current medication. - Continue amlodipine  10 mg daily. - Continue olmesartan  40 mg daily.  Mixed hyperlipidemia LDL cholesterol controlled at 59 mg/dL with current medication. - Continue Crestor  40 mg daily.              Follow-up: Return in about 1 year (around 06/14/2025).  Signed, Darryle DASEN. Barbaraann, MD, [facility]  [facility]  [address] [place], [place] [address] [phone]  9:51 AM

## 2024-06-14 ENCOUNTER — Ambulatory Visit: Payer: Self-pay | Attending: Internal Medicine | Admitting: Cardiovascular Disease

## 2024-06-14 ENCOUNTER — Encounter: Payer: Self-pay | Admitting: Cardiovascular Disease

## 2024-06-14 VITALS — BP 118/74 | HR 62 | Ht 74.0 in | Wt 235.4 lb

## 2024-06-14 DIAGNOSIS — I2510 Atherosclerotic heart disease of native coronary artery without angina pectoris: Secondary | ICD-10-CM | POA: Diagnosis not present

## 2024-06-14 DIAGNOSIS — I7121 Aneurysm of the ascending aorta, without rupture: Secondary | ICD-10-CM

## 2024-06-14 DIAGNOSIS — E782 Mixed hyperlipidemia: Secondary | ICD-10-CM | POA: Diagnosis not present

## 2024-06-14 NOTE — Patient Instructions (Signed)
 Medication Instructions:  Your physician recommends that you continue on your current medications as directed. Please refer to the Current Medication list given to you today.  *If you need a refill on your cardiac medications before your next appointment, please call your pharmacy*  Lab Work: Your physician recommends that you have labs drawn today: BMET  If you have labs (blood work) drawn today and your tests are completely normal, you will receive your results only by: MyChart Message (if you have MyChart) OR A paper copy in the mail If you have any lab test that is abnormal or we need to change your treatment, we will call you to review the results.  Testing/Procedures: Non-Cardiac CT Angiography (CTA) chest/aorta, is a special type of CT scan that uses a computer to produce multi-dimensional views of major blood vessels throughout the body. In CT angiography, a contrast material is injected through an IV to help visualize the blood vessels   Follow-Up: At [facility], you and your health needs are our priority.  As part of our continuing mission to provide you with exceptional heart care, our providers are all part of one team.  This team includes your primary Cardiologist (physician) and Advanced Practice Providers or APPs (Physician Assistants and Nurse Practitioners) who all work together to provide you with the care you need, when you need it.  Your next appointment:   12 month(s)  Provider:   Dr. Barbaraann or Jon Hails, PA-C, Callie Goodrich, PA-C, Kathleen Johnson, PA-C, Hao Meng, PA-C, Damien Braver, NP, or Katlyn West, NP

## 2024-06-15 LAB — BASIC METABOLIC PANEL WITH GFR
BUN/Creatinine Ratio: 20 (ref 10–24)
BUN: 20 mg/dL (ref 8–27)
CO2: 23 mmol/L (ref 20–29)
Calcium: 10.1 mg/dL (ref 8.6–10.2)
Chloride: 101 mmol/L (ref 96–106)
Creatinine, Ser: 1.01 mg/dL (ref 0.76–1.27)
Glucose: 128 mg/dL — ABNORMAL HIGH (ref 70–99)
Potassium: 4.2 mmol/L (ref 3.5–5.2)
Sodium: 142 mmol/L (ref 134–144)
eGFR: 83 mL/min/1.73 (ref >59)

## 2024-06-17 ENCOUNTER — Ambulatory Visit: Payer: Self-pay | Admitting: Cardiovascular Disease

## 2024-06-20 DIAGNOSIS — M9903 Segmental and somatic dysfunction of lumbar region: Secondary | ICD-10-CM | POA: Diagnosis not present

## 2024-06-20 DIAGNOSIS — M9902 Segmental and somatic dysfunction of thoracic region: Secondary | ICD-10-CM | POA: Diagnosis not present

## 2024-06-20 DIAGNOSIS — M9905 Segmental and somatic dysfunction of pelvic region: Secondary | ICD-10-CM | POA: Diagnosis not present

## 2024-06-20 DIAGNOSIS — M62830 Muscle spasm of back: Secondary | ICD-10-CM | POA: Diagnosis not present

## 2024-06-22 ENCOUNTER — Ambulatory Visit (HOSPITAL_COMMUNITY)
Admission: RE | Admit: 2024-06-22 | Discharge: 2024-06-22 | Disposition: A | Discharge status: Home / Self Care | Source: Ambulatory Visit | Attending: Cardiovascular Disease | Admitting: Cardiovascular Disease

## 2024-06-22 DIAGNOSIS — I7121 Aneurysm of the ascending aorta, without rupture: Secondary | ICD-10-CM | POA: Insufficient documentation

## 2024-06-22 DIAGNOSIS — I2510 Atherosclerotic heart disease of native coronary artery without angina pectoris: Secondary | ICD-10-CM | POA: Diagnosis not present

## 2024-06-22 MED ORDER — IOHEXOL 350 MG/ML SOLN
80.0000 mL | Freq: Once | INTRAVENOUS | Status: AC | PRN
  Administered 2024-06-22: 80 mL via INTRAVENOUS

## 2024-07-04 DIAGNOSIS — M5442 Lumbago with sciatica, left side: Secondary | ICD-10-CM | POA: Diagnosis not present

## 2024-07-04 DIAGNOSIS — M9905 Segmental and somatic dysfunction of pelvic region: Secondary | ICD-10-CM | POA: Diagnosis not present

## 2024-07-04 DIAGNOSIS — M9902 Segmental and somatic dysfunction of thoracic region: Secondary | ICD-10-CM | POA: Diagnosis not present

## 2024-07-04 DIAGNOSIS — M9903 Segmental and somatic dysfunction of lumbar region: Secondary | ICD-10-CM | POA: Diagnosis not present

## 2024-07-05 ENCOUNTER — Other Ambulatory Visit: Payer: Self-pay | Admitting: Nurse Practitioner

## 2024-07-05 DIAGNOSIS — M5440 Lumbago with sciatica, unspecified side: Secondary | ICD-10-CM

## 2024-07-05 DIAGNOSIS — M545 Low back pain, unspecified: Secondary | ICD-10-CM | POA: Diagnosis not present

## 2024-07-05 DIAGNOSIS — G8929 Other chronic pain: Secondary | ICD-10-CM | POA: Diagnosis not present

## 2024-07-05 DIAGNOSIS — Z79899 Other long term (current) drug therapy: Secondary | ICD-10-CM | POA: Diagnosis not present

## 2024-07-05 DIAGNOSIS — M5442 Lumbago with sciatica, left side: Secondary | ICD-10-CM | POA: Diagnosis not present

## 2024-07-17 ENCOUNTER — Ambulatory Visit
Admission: RE | Admit: 2024-07-17 | Discharge: 2024-07-17 | Disposition: A | Source: Ambulatory Visit | Attending: Nurse Practitioner | Admitting: Nurse Practitioner

## 2024-07-17 DIAGNOSIS — G8929 Other chronic pain: Secondary | ICD-10-CM

## 2024-07-17 DIAGNOSIS — M5117 Intervertebral disc disorders with radiculopathy, lumbosacral region: Secondary | ICD-10-CM | POA: Diagnosis not present

## 2024-07-17 DIAGNOSIS — M5440 Lumbago with sciatica, unspecified side: Secondary | ICD-10-CM

## 2024-07-17 DIAGNOSIS — M4727 Other spondylosis with radiculopathy, lumbosacral region: Secondary | ICD-10-CM | POA: Diagnosis not present

## 2024-07-17 DIAGNOSIS — M48061 Spinal stenosis, lumbar region without neurogenic claudication: Secondary | ICD-10-CM | POA: Diagnosis not present

## 2024-07-18 DIAGNOSIS — E1165 Type 2 diabetes mellitus with hyperglycemia: Secondary | ICD-10-CM | POA: Diagnosis not present

## 2024-07-18 DIAGNOSIS — Z125 Encounter for screening for malignant neoplasm of prostate: Secondary | ICD-10-CM | POA: Diagnosis not present

## 2024-07-18 DIAGNOSIS — I10 Essential (primary) hypertension: Secondary | ICD-10-CM | POA: Diagnosis not present

## 2024-07-18 DIAGNOSIS — E039 Hypothyroidism, unspecified: Secondary | ICD-10-CM | POA: Diagnosis not present
